# Patient Record
Sex: Male | Born: 1970 | Race: Black or African American | Hispanic: No | Smoking: Never smoker
Health system: Southern US, Community
[De-identification: ages and names within clinical notes are randomized; demographics above are authoritative.]

## PROBLEM LIST (undated history)

## (undated) DIAGNOSIS — G4700 Insomnia, unspecified: Secondary | ICD-10-CM

## (undated) DIAGNOSIS — Z9889 Other specified postprocedural states: Secondary | ICD-10-CM

## (undated) DIAGNOSIS — E8881 Metabolic syndrome: Secondary | ICD-10-CM

## (undated) DIAGNOSIS — I10 Essential (primary) hypertension: Secondary | ICD-10-CM

## (undated) DIAGNOSIS — R7989 Other specified abnormal findings of blood chemistry: Secondary | ICD-10-CM

## (undated) DIAGNOSIS — E669 Obesity, unspecified: Secondary | ICD-10-CM

## (undated) DIAGNOSIS — E785 Hyperlipidemia, unspecified: Secondary | ICD-10-CM

## (undated) DIAGNOSIS — B009 Herpesviral infection, unspecified: Secondary | ICD-10-CM

## (undated) DIAGNOSIS — Z8042 Family history of malignant neoplasm of prostate: Secondary | ICD-10-CM

## (undated) DIAGNOSIS — K219 Gastro-esophageal reflux disease without esophagitis: Secondary | ICD-10-CM

## (undated) DIAGNOSIS — N200 Calculus of kidney: Secondary | ICD-10-CM

## (undated) DIAGNOSIS — N529 Male erectile dysfunction, unspecified: Secondary | ICD-10-CM

## (undated) DIAGNOSIS — R7301 Impaired fasting glucose: Secondary | ICD-10-CM

## (undated) DIAGNOSIS — E88819 Insulin resistance, unspecified: Secondary | ICD-10-CM

## (undated) DIAGNOSIS — G4733 Obstructive sleep apnea (adult) (pediatric): Secondary | ICD-10-CM

## (undated) HISTORY — DX: Other specified abnormal findings of blood chemistry: R79.89

## (undated) HISTORY — DX: Insulin resistance, unspecified: E88.819

## (undated) HISTORY — DX: Essential (primary) hypertension: I10

## (undated) HISTORY — DX: Herpesviral infection, unspecified: B00.9

## (undated) HISTORY — DX: Gastro-esophageal reflux disease without esophagitis: K21.9

## (undated) HISTORY — DX: Insomnia, unspecified: G47.00

## (undated) HISTORY — DX: Obesity, unspecified: E66.9

## (undated) HISTORY — DX: Obstructive sleep apnea (adult) (pediatric): G47.33

## (undated) HISTORY — DX: Impaired fasting glucose: R73.01

## (undated) HISTORY — PX: MOUTH SURGERY: SHX715

## (undated) HISTORY — DX: Metabolic syndrome: E88.81

## (undated) HISTORY — DX: Male erectile dysfunction, unspecified: N52.9

## (undated) HISTORY — PX: APPENDECTOMY: SHX54

## (undated) HISTORY — DX: Hyperlipidemia, unspecified: E78.5

## (undated) HISTORY — DX: Family history of malignant neoplasm of prostate: Z80.42

## (undated) HISTORY — DX: Calculus of kidney: N20.0

---

## 1999-12-28 ENCOUNTER — Encounter (INDEPENDENT_AMBULATORY_CARE_PROVIDER_SITE_OTHER): Payer: Self-pay | Admitting: *Deleted

## 1999-12-28 ENCOUNTER — Inpatient Hospital Stay (HOSPITAL_COMMUNITY): Admission: EM | Admit: 1999-12-28 | Discharge: 1999-12-29 | Payer: Self-pay | Admitting: Emergency Medicine

## 2001-08-15 ENCOUNTER — Encounter: Payer: Self-pay | Admitting: Internal Medicine

## 2001-08-15 ENCOUNTER — Encounter: Admission: RE | Admit: 2001-08-15 | Discharge: 2001-08-15 | Payer: Self-pay | Admitting: Internal Medicine

## 2008-10-03 ENCOUNTER — Ambulatory Visit: Payer: Self-pay | Admitting: Family Medicine

## 2008-11-14 ENCOUNTER — Ambulatory Visit: Payer: Self-pay | Admitting: Family Medicine

## 2009-03-24 ENCOUNTER — Ambulatory Visit: Payer: Self-pay | Admitting: Family Medicine

## 2009-04-22 ENCOUNTER — Ambulatory Visit: Payer: Self-pay | Admitting: Family Medicine

## 2009-06-19 ENCOUNTER — Ambulatory Visit: Payer: Self-pay | Admitting: Family Medicine

## 2010-11-27 NOTE — Op Note (Signed)
[place]. [facility]  Patient:    Charles Rojas, Charles Rojas                    MRN: [id number] Proc. Date: 12/28/99 Adm. Date:  [id number] Disc. Date: [id number] Attending:  Arlis Porta                           Operative Report  PREOPERATIVE DIAGNOSIS:  Acute appendicitis.  POSTOPERATIVE DIAGNOSIS:  Acute appendicitis.  OPERATION PERFORMED:  Laparoscopic appendectomy.  SURGEON:  Adolph Pollack, M.D.  ASSISTANT:  None.  ANESTHESIA:  General.  INDICATIONS FOR PROCEDURE:  The patient is a 40 year old male who presented to the emergency department with signs and symptoms of classic appendicitis and elevated white blood count.  He was brought to the operating room now for appendectomy.  He has chosen laparoscopic technique as recommended by me.  DESCRIPTION OF PROCEDURE:  He was placed supine on the operating table and the general anesthetic was administered.  A Foley catheter was placed in the bladder.  The abdomen was sterilely prepped and draped.  Local anesthetic of 0.5% plain Marcaine was infiltrated in the subumbilical region and a subumbilical incision made incising the skin sharply and carrying this down through the subcutaneous tissue bluntly.  Midline fascia was identified and a 1 cm incision was made in the midline fascia.  The peritoneum was identified and a small incision made in the peritoneum under direct vision and the peritoneal cavity was entered.  A pursestring suture of 0 Vicryl was placed around the fascial edges.  A Hasson trocar was introduced into the peritoneal cavity and a pneumoperitoneum created by insufflation of CO2 gas.  Next the laparoscope was introduced. What I noted was a very large edematous distal appendix with adhered omentum to it.  Under direct vision, a 5 mm trocar was placed into the left lower quadrant, one in the right upper quadrant.  The omentum was swept free from the appendix.  The mesoappendix  was grasped and was very thick.  It was divided slowly using the Harmonic scalpel until I identified the junction of the appendix and the cecum.  After the junction had been skeletonized and the appendiceal artery divided using the Harmonic scalpel, the appendix was them amputated from the cecum using the Endo GIA stapler.  The inflamed appendix was placed in an Endo pouch bag and it was removed from the abdominal cavity and sent to pathology.  Next, the right lower quadrant area was copiously irrigated and inspected for hemostasis.  No active bleeding was noted and the majority of the irrigation fluid was evacuated.  The left lower quadrant trocar was  removed and no bleeding noted.  The midline trocar was removed and the fascial defect closed under laparoscopic vision with a 0 Vicryl pursestring suture.  The right upper quadrant 5 mm trocar was removed and a pneumoperitoneum was released.  The skin incision was then closed with 4-0 Monocryl subcuticular stitches followed by Steri-Strips and sterile dressings.  The patient tolerated the procedure well without apparent complications and was taken to the recovery room in satisfactory condition. DD:  12/29/99 TD:  12/31/99 Job: [id number]

## 2010-11-27 NOTE — H&P (Signed)
[place]. [facility]  Patient:    Charles Rojas, Charles Rojas                    MRN: [id number] Adm. Date:  [id number] Attending:  Arlis Porta CC:         Charles Rojas. Charles Rojas, M.D.                         History and Physical  CHIEF COMPLAINT: Right lower quadrant abdominal pain.  HISTORY OF PRESENT ILLNESS: This patient is a 40 year old male, otherwise healthy, who awoke this morning at 6 a.m. and had some diffuse lower abdominal periumbilical pain.  This continued to be fairly severe throughout the day and then it radiated to his right lower quadrant.  It was associated with some nausea, vomiting, and anorexia.  He had a bowel movement this morning without relief.  He tried taking ibuprofen and Kaopectate but had no relief with that. He denied any fever or chills.  He stated that approximately a year ago he ad a similar episode but which was very short-lived.  He saw a gastroenterologist and the work-up was negative.  He was seen at the [facility] clinic, where urinalysis demonstrated trace blood.  WBC was 16,400 with a hemoglobin of 13.7.  He was subsequently sent to the emergency department because of his persistent abdominal pain.  PAST MEDICAL HISTORY: No chronic illnesses.  PAST SURGICAL HISTORY: Wisdom tooth extraction.  ALLERGIES: No known drug allergies.  MEDICATIONS: None.  SOCIAL HISTORY: He denies tobacco or alcohol use.  He works two jobs, one of which is for [employer].  He is married.  FAMILY HISTORY: Mother has diabetes.  REVIEW OF SYSTEMS: CARDIOVASCULAR: No known heart disease or hypertension. PULMONARY: No asthma, pneumonia, or tuberculosis.  GI: No peptic ulcer disease, hepatitis, colitis.  GU: No history of kidney stones, dysuria, or hematuria.  ENDOCRINE: No diabetes or thyroid disease.  NEUROLOGIC: No seizure disorder.  HEMATOLOGIC: No known bleeding disorders or transfusions.  PHYSICAL EXAMINATION:  GENERAL:  Well-developed, well-nourished male in no acute distress.  He is pleasant and cooperative.  VITAL SIGNS: Temperature 99.5 degrees, blood pressure 145/68, pulse 93.  HEENT: EOMI.  Sclerae clear.  NECK: Supple without any palpable masses or thyromegaly.  CARDIOVASCULAR: Heart demonstrates regular rate and rhythm without murmur.  PULMONARY: Breath sounds are equal and clear.  Respirations are unlabored.  ABDOMEN: Soft.  There is right lower quadrant focal guarding and tenderness to both palpation and percussion.  Hypoactive bowel sounds are present.  No masses are palpable.  MUSCULOSKELETAL: Extremities are warm, without cyanosis or edema.  LABORATORY DATA: WBC in the emergency department was 16,500, hemoglobin 13.5. Electrolyte panel was within normal limits.  IMPRESSION: Right lower quadrant abdominal pain - findings consistent with acute appendicitis.  Other differential diagnoses could include mesenteric adenitis versus Crohns disease, although I doubt the latter.  PLAN: Appendectomy.  I did explain the procedure and the risks including, but not limited to bleeding, infection, risks of anesthesia, accidental injury to bladder or other intra-abdominal organs.  Both he and his wife seem to understand this and they agreed to proceed. DD:  12/28/99 TD:  12/29/99 Job: [id number]

## 2011-01-05 ENCOUNTER — Ambulatory Visit (INDEPENDENT_AMBULATORY_CARE_PROVIDER_SITE_OTHER): Payer: BC Managed Care – PPO | Admitting: Medical

## 2011-01-05 ENCOUNTER — Encounter: Payer: Self-pay | Admitting: Medical

## 2011-01-05 DIAGNOSIS — R319 Hematuria, unspecified: Secondary | ICD-10-CM

## 2011-01-05 DIAGNOSIS — B009 Herpesviral infection, unspecified: Secondary | ICD-10-CM

## 2011-01-05 DIAGNOSIS — Z8042 Family history of malignant neoplasm of prostate: Secondary | ICD-10-CM

## 2011-01-05 DIAGNOSIS — Z113 Encounter for screening for infections with a predominantly sexual mode of transmission: Secondary | ICD-10-CM

## 2011-01-05 LAB — CBC WITH DIFFERENTIAL/PLATELET
Basophils Absolute: 0 10*3/uL (ref 0.0–0.1)
Basophils Relative: 1 % (ref 0–1)
Eosinophils Absolute: 0.1 10*3/uL (ref 0.0–0.7)
Eosinophils Relative: 1 % (ref 0–5)
HCT: 44.6 % (ref 39.0–52.0)
Hemoglobin: 13.6 g/dL (ref 13.0–17.0)
Lymphocytes Relative: 32 % (ref 12–46)
Lymphs Abs: 2.5 10*3/uL (ref 0.7–4.0)
MCH: 23.7 pg — ABNORMAL LOW (ref 26.0–34.0)
MCHC: 30.5 g/dL (ref 30.0–36.0)
MCV: 77.8 fL — ABNORMAL LOW (ref 78.0–100.0)
Monocytes Absolute: 1 10*3/uL (ref 0.1–1.0)
Monocytes Relative: 13 % — ABNORMAL HIGH (ref 3–12)
Neutro Abs: 4.1 10*3/uL (ref 1.7–7.7)
Neutrophils Relative %: 53 % (ref 43–77)
Platelets: 319 10*3/uL (ref 150–400)
RBC: 5.73 MIL/uL (ref 4.22–5.81)
RDW: 14.5 % (ref 11.5–15.5)
WBC: 7.6 10*3/uL (ref 4.0–10.5)

## 2011-01-05 LAB — COMPREHENSIVE METABOLIC PANEL
ALT: 25 U/L (ref 0–53)
AST: 21 U/L (ref 0–37)
Albumin: 4.3 g/dL (ref 3.5–5.2)
Alkaline Phosphatase: 63 U/L (ref 39–117)
BUN: 11 mg/dL (ref 6–23)
CO2: 24 mEq/L (ref 19–32)
Calcium: 9.7 mg/dL (ref 8.4–10.5)
Chloride: 105 mEq/L (ref 96–112)
Creat: 0.88 mg/dL (ref 0.50–1.35)
Glucose, Bld: 84 mg/dL (ref 70–99)
Potassium: 4.1 mEq/L (ref 3.5–5.3)
Sodium: 139 mEq/L (ref 135–145)
Total Bilirubin: 0.3 mg/dL (ref 0.3–1.2)
Total Protein: 7.1 g/dL (ref 6.0–8.3)

## 2011-01-05 MED ORDER — VALACYCLOVIR HCL 500 MG PO TABS: ORAL_TABLET | ORAL | Status: DC

## 2011-01-05 NOTE — Progress Notes (Signed)
Subjective:   HPI  Charles Rojas. is a 40 y.o. male who presents for hematuria.  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  Otherwise he has been in his normal state of health, without complaints. Denies prior blood in urine. He denies any urinary problems, no history of STDs other than herpes, no concern for STDs currently. He is in a monogamous relationship. He denies any recent trauma. No abdominal pain or back pain. There is prostate cancer history in the family.  No other aggravating or relieving factors.  No other c/o.  The following portions of the patient's history were reviewed and updated as appropriate: allergies, current medications, past family history, past medical history, past social history, past surgical history and problem list.   Review of Systems Constitutional: denies fever, chills, sweats, unexpected weight change, anorexia, fatigue Dermatology: denies rash Cardiology: denies chest pain, palpitations, edema Respiratory: denies cough, shortness of breath, wheezing Gastroenterology: denies abdominal pain, nausea, vomiting, diarrhea, constipation Hematology: denies bleeding or bruising problems Musculoskeletal: denies arthralgias, myalgias, joint swelling, back pain Urology: denies dysuria, difficulty urinating, hematuria, urinary frequency, urgency     Objective:   Physical Exam  General appearance: alert, no distress, WD/WN, black male Heart: RRR, normal S1, S2, no murmurs Lungs: CTA bilaterally, no wheezes, rhonchi, or rales Abdomen: +bs, soft, non tender, non distended, no masses, no hepatomegaly, no splenomegaly Back: non tender Extremities: no edema, no cyanosis, no clubbing Pulses: 2+ symmetric GU: Normal external male genitalia, no lesions, no masses, no lymphadenopathy Rectal: Anus normal appearing, nontender, prostate within normal limits   Assessment :    Encounter Diagnoses  Name Primary?  . Hematuria Yes  .  Family history of prostate cancer   . Screening for venereal disease   . Herpes      Plan:    Hematuria - asymptomatic found on recent DOT physical.  Additional eval today.  If all tests today normal, repeat UA in 2-3 wks.  If still positive, consider urology referral.  Labs today - urine culture, urinalysis, chlamydia and gonorrhea swabs, and CBC.  Refilled his Valtrex today which controls his infrequent outbreaks.

## 2011-01-06 ENCOUNTER — Encounter: Payer: Self-pay | Admitting: Family Medicine

## 2011-01-06 LAB — GC/CHLAMYDIA PROBE AMP, GENITAL
Chlamydia, DNA Probe: NEGATIVE
GC Probe Amp, Genital: NEGATIVE

## 2011-01-07 LAB — URINE CULTURE
Colony Count: NO GROWTH
Organism ID, Bacteria: NO GROWTH

## 2011-01-08 ENCOUNTER — Telehealth: Payer: Self-pay | Admitting: *Deleted

## 2011-01-08 NOTE — Telephone Encounter (Addendum)
Message copied by Dorthula Perfect on Fri Jan 08, 2011 12:36 PM ------      Message from: Aleen Campi, Kermit Balo      Created: Fri Jan 08, 2011  7:45 AM       All labs normal - liver, kidney, lytes, urine culture, gonorrhea and chlamydia all normal.  Repeat nurse visit urinalysis in 2-3 weeks, also BP check at that time.    Pt notified of all lab results.  Pt will call back to schedule a nurse visit for urinalysis and BP check.  CM, LPN

## 2011-01-30 ENCOUNTER — Emergency Department (HOSPITAL_COMMUNITY)
Admission: EM | Admit: 2011-01-30 | Discharge: 2011-01-31 | Disposition: A | Discharge status: Home / Self Care | Payer: BC Managed Care – PPO | Attending: Emergency Medicine | Admitting: Emergency Medicine

## 2011-01-30 ENCOUNTER — Emergency Department (HOSPITAL_COMMUNITY): Payer: BC Managed Care – PPO

## 2011-01-30 DIAGNOSIS — N200 Calculus of kidney: Secondary | ICD-10-CM | POA: Insufficient documentation

## 2011-01-30 DIAGNOSIS — R109 Unspecified abdominal pain: Secondary | ICD-10-CM | POA: Insufficient documentation

## 2011-01-30 DIAGNOSIS — R110 Nausea: Secondary | ICD-10-CM | POA: Insufficient documentation

## 2011-01-30 LAB — URINE MICROSCOPIC-ADD ON

## 2011-01-30 LAB — URINALYSIS, ROUTINE W REFLEX MICROSCOPIC
Bilirubin Urine: NEGATIVE
Glucose, UA: NEGATIVE mg/dL
Ketones, ur: NEGATIVE mg/dL
Leukocytes, UA: NEGATIVE
Nitrite: NEGATIVE
Protein, ur: NEGATIVE mg/dL
Specific Gravity, Urine: 1.034 — ABNORMAL HIGH (ref 1.005–1.030)
Urobilinogen, UA: 0.2 mg/dL (ref 0.0–1.0)
pH: 6 (ref 5.0–8.0)

## 2011-04-20 ENCOUNTER — Ambulatory Visit (INDEPENDENT_AMBULATORY_CARE_PROVIDER_SITE_OTHER): Payer: BC Managed Care – PPO | Admitting: Family Medicine

## 2011-04-20 ENCOUNTER — Encounter: Payer: Self-pay | Admitting: Family Medicine

## 2011-04-20 VITALS — BP 144/100 | HR 84 | Ht 72.0 in | Wt 225.0 lb

## 2011-04-20 DIAGNOSIS — M25529 Pain in unspecified elbow: Secondary | ICD-10-CM

## 2011-04-20 DIAGNOSIS — M25522 Pain in left elbow: Secondary | ICD-10-CM

## 2011-04-20 NOTE — Progress Notes (Signed)
  Subjective:    Patient ID: Charles Rojas., male    DOB: 1971-07-10, 40 y.o.   MRN: [id number]  HPI He is here for evaluation of left elbow pain. The pain occurs when he leans on his elbow and goes away very quickly after he takes the pressure off of it. He has no history of injury to this.  Review of Systems     Objective:   Physical Exam Full motion of the available. No tenderness to palpation over the lateral or medial epicondyle. No joint effusion noted. No tenderness over the olecranon process.       Assessment & Plan:  Left elbow pain probable compression of cutaneous nerve I reassured him that he was not in any danger. Recommend that he change oval position to get rid of the pain

## 2011-04-20 NOTE — Patient Instructions (Signed)
Change elbow positions to get rid of the pain

## 2011-06-09 ENCOUNTER — Telehealth: Payer: Self-pay | Admitting: Family Medicine

## 2011-06-09 ENCOUNTER — Other Ambulatory Visit: Payer: Self-pay | Admitting: Internal Medicine

## 2011-06-09 MED ORDER — VALACYCLOVIR HCL 500 MG PO TABS: ORAL_TABLET | ORAL | Status: DC

## 2011-06-09 NOTE — Telephone Encounter (Signed)
Sent valtrex to the wrong pharmacy earlier. Did send it to [facility]

## 2011-06-09 NOTE — Telephone Encounter (Signed)
ok 

## 2011-06-09 NOTE — Telephone Encounter (Signed)
Refilled med for pt

## 2011-06-09 NOTE — Telephone Encounter (Signed)
Is this okay to refill? 

## 2011-12-02 ENCOUNTER — Other Ambulatory Visit: Payer: Self-pay | Admitting: Family Medicine

## 2011-12-02 NOTE — Telephone Encounter (Signed)
RX REFILL FOR VALTREX

## 2012-07-26 ENCOUNTER — Ambulatory Visit (INDEPENDENT_AMBULATORY_CARE_PROVIDER_SITE_OTHER): Payer: BC Managed Care – PPO | Admitting: Medical

## 2012-07-26 ENCOUNTER — Encounter: Payer: Self-pay | Admitting: Medical

## 2012-07-26 VITALS — BP 130/80 | HR 80 | Temp 98.2°F | Resp 16 | Wt 225.0 lb

## 2012-07-26 DIAGNOSIS — N509 Disorder of male genital organs, unspecified: Secondary | ICD-10-CM

## 2012-07-26 DIAGNOSIS — G4700 Insomnia, unspecified: Secondary | ICD-10-CM

## 2012-07-26 DIAGNOSIS — Z113 Encounter for screening for infections with a predominantly sexual mode of transmission: Secondary | ICD-10-CM

## 2012-07-26 LAB — CBC WITH DIFFERENTIAL/PLATELET
Basophils Absolute: 0 10*3/uL (ref 0.0–0.1)
Basophils Relative: 0 % (ref 0–1)
Eosinophils Absolute: 0.1 10*3/uL (ref 0.0–0.7)
Eosinophils Relative: 2 % (ref 0–5)
HCT: 41.2 % (ref 39.0–52.0)
Hemoglobin: 13.2 g/dL (ref 13.0–17.0)
Lymphocytes Relative: 37 % (ref 12–46)
Lymphs Abs: 2.3 10*3/uL (ref 0.7–4.0)
MCH: 23.7 pg — ABNORMAL LOW (ref 26.0–34.0)
MCHC: 32 g/dL (ref 30.0–36.0)
MCV: 74.1 fL — ABNORMAL LOW (ref 78.0–100.0)
Monocytes Absolute: 0.8 10*3/uL (ref 0.1–1.0)
Monocytes Relative: 14 % — ABNORMAL HIGH (ref 3–12)
Neutro Abs: 2.9 10*3/uL (ref 1.7–7.7)
Neutrophils Relative %: 47 % (ref 43–77)
Platelets: 328 10*3/uL (ref 150–400)
RBC: 5.56 MIL/uL (ref 4.22–5.81)
RDW: 15 % (ref 11.5–15.5)
WBC: 6.1 10*3/uL (ref 4.0–10.5)

## 2012-07-26 LAB — RPR

## 2012-07-26 MED ORDER — ESZOPICLONE 2 MG PO TABS: 2.0000 mg | ORAL_TABLET | Freq: Every day | ORAL | Status: DC

## 2012-07-26 MED ORDER — DOXYCYCLINE HYCLATE 100 MG PO TABS: 100.0000 mg | ORAL_TABLET | Freq: Two times a day (BID) | ORAL | Status: DC

## 2012-07-26 NOTE — Progress Notes (Signed)
Subjective: Here for herpes outbreak.  Normally gets outbreak on head of penis.  This time seems to have outbreak on left scrotum and where this is rubbing the thigh, seems to have a rash on the thigh.  Did not start as vesicle like usual.  More of a bump/pimple like lesion.  Has Valtrex at home, started this Friday when he noticed symptoms beginning.  It is helping.  Last night inner thigh started getting some burning where the scrotal lesion had been rubbing.   Separated, has 1 sexual partner currently x over 1 year.  Other than genital herpes, no other prior STD.    He also c/o sleep problems. Has used lunesta in the past, wants refill.  Has lots of stressors currently, having hard time getting to sleep due to mind thinking about thinks.  Usually sleeps 5-6 hours in general, but lately, much less at times.  Dealing with divorce issues from ex - wife.   Exercises on the job, [employer].   Objective: Gen: wd, wn, nad Skin: left scrotum with 0.8 cm diameter raised, slightly tender lesion, cystic appearing with small opening in the middle with mild erythema, no obvious drainage.   Small area of faint erythema in left inguinal crease where the scrotal lesion has rubbed.   otherwise penis, testicles nontender, no oother mass or skin lesion, no lymphadenopathy  Assessment: Encounter Diagnoses  Name Primary?  . Skin lesion of scrotum Yes  . Screen for STD (sexually transmitted disease)   . Insomnia    Plan: Skin lesion - cyst vs folliculitis vs STD, but doesn't appear to be herpetic.  Begin doxycycline, warm compresses, labs today.  STD screening today  insomnia - script for lunesta, discussed use prn, sparingly, work on stress reduction, relaxation, exercise.    F/u pending labs.

## 2012-07-27 ENCOUNTER — Telehealth: Payer: Self-pay | Admitting: Medical

## 2012-07-27 LAB — GC/CHLAMYDIA PROBE AMP, URINE
Chlamydia, Swab/Urine, PCR: NEGATIVE
GC Probe Amp, Urine: NEGATIVE

## 2012-07-27 LAB — HIV ANTIBODY (ROUTINE TESTING W REFLEX): HIV: NONREACTIVE

## 2012-07-31 NOTE — Telephone Encounter (Signed)
LM

## 2012-12-18 ENCOUNTER — Ambulatory Visit: Payer: Self-pay | Admitting: Family Medicine

## 2013-01-10 ENCOUNTER — Ambulatory Visit (INDEPENDENT_AMBULATORY_CARE_PROVIDER_SITE_OTHER): Payer: BC Managed Care – PPO | Admitting: Family Medicine

## 2013-01-10 ENCOUNTER — Encounter: Payer: Self-pay | Admitting: Family Medicine

## 2013-01-10 VITALS — BP 140/86 | HR 90 | Wt 220.0 lb

## 2013-01-10 DIAGNOSIS — M545 Low back pain: Secondary | ICD-10-CM

## 2013-01-10 NOTE — Progress Notes (Signed)
  Subjective:    Patient ID: Charles Saver., male    DOB: [birth year], 42 y.o.   MRN: [id number]  HPI Yesterday morning he woke up with back pain. He does not remember any injury prior to that he does work 2 jobs. He went to work however work half a day because of discomfort he did go to his other job and was able to work is normal 4 hours. He did use a heating pad last night and states that since same discomfort today He did try one muscle relaxer. The pain is made worse with motion but no radiation, numbness or tingling.   Review of Systems     Objective:   Physical Exam No tenderness to palpation of his back or lesions noted. Normal lumbar curve with good motion. No tenderness over SI joints. Hip motion is normal. DTRs were slightly diminished bilaterally. Negative straight leg raising.       Assessment & Plan:  Low back pain  back care instructions given. Also demonstrated proper lifting sitting and standing as well as discussed proper posturing while lying in bed. Recommend Aleve. He is to return here if no improvement within the next 10 days or if he gets worse. He will call me if he needs stronger pain medication.

## 2013-01-10 NOTE — Patient Instructions (Signed)
Back Pain, Adult Low back pain is very common. About 1 in 5 people have back pain.The cause of low back pain is rarely dangerous. The pain often gets better over time.About half of people with a sudden onset of back pain feel better in just 2 weeks. About 8 in 10 people feel better by 6 weeks.  CAUSES Some common causes of back pain include:  Strain of the muscles or ligaments supporting the spine.  Wear and tear (degeneration) of the spinal discs.  Arthritis.  Direct injury to the back. DIAGNOSIS Most of the time, the direct cause of low back pain is not known.However, back pain can be treated effectively even when the exact cause of the pain is unknown.Answering your caregiver's questions about your overall health and symptoms is one of the most accurate ways to make sure the cause of your pain is not dangerous. If your caregiver needs more information, he or she may order lab work or imaging tests (X-rays or MRIs).However, even if imaging tests show changes in your back, this usually does not require surgery. HOME CARE INSTRUCTIONS For many people, back pain returns.Since low back pain is rarely dangerous, it is often a condition that people can learn to manageon their own.   Remain active. It is stressful on the back to sit or stand in one place. Do not sit, drive, or stand in one place for more than 30 minutes at a time. Take short walks on level surfaces as soon as pain allows.Try to increase the length of time you walk each day.  Do not stay in bed.Resting more than 1 or 2 days can delay your recovery.  Do not avoid exercise or work.Your body is made to move.It is not dangerous to be active, even though your back may hurt.Your back will likely heal faster if you return to being active before your pain is gone.  Pay attention to your body when you bend and lift. Many people have less discomfortwhen lifting if they bend their knees, keep the load close to their bodies,and  avoid twisting. Often, the most comfortable positions are those that put less stress on your recovering back.  Find a comfortable position to sleep. Use a firm mattress and lie on your side with your knees slightly bent. If you lie on your back, put a pillow under your knees.  Only take over-the-counter or prescription medicines as directed by your caregiver. Over-the-counter medicines to reduce pain and inflammation are often the most helpful.Your caregiver may prescribe muscle relaxant drugs.These medicines help dull your pain so you can more quickly return to your normal activities and healthy exercise.  Put ice on the injured area.  Put ice in a plastic bag.  Place a towel between your skin and the bag.  Leave the ice on for 15-20 minutes, 3-4 times a day for the first 2 to 3 days. After that, ice and heat may be alternated to reduce pain and spasms.  Ask your caregiver about trying back exercises and gentle massage. This may be of some benefit.  Avoid feeling anxious or stressed.Stress increases muscle tension and can worsen back pain.It is important to recognize when you are anxious or stressed and learn ways to manage it.Exercise is a great option. SEEK MEDICAL CARE IF:  You have pain that is not relieved with rest or medicine.  You have pain that does not improve in 1 week.  You have new symptoms.  You are generally not feeling well. SEEK   IMMEDIATE MEDICAL CARE IF:   You have pain that radiates from your back into your legs.  You develop new bowel or bladder control problems.  You have unusual weakness or numbness in your arms or legs.  You develop nausea or vomiting.  You develop abdominal pain.  You feel faint. Document Released: 06/28/2005 Document Revised: 12/28/2011 Document Reviewed: 11/16/2010 [facility] Patient Information 2014 [place], [place]. Take 2 Aleve twice per day use heat for 20 minutes 2 or 3 times per day. I have no problem to continue to work.  Will day my rules in terms of proper posturing lifting sitting and

## 2013-06-26 ENCOUNTER — Encounter: Payer: Self-pay | Admitting: Medical

## 2013-06-26 ENCOUNTER — Ambulatory Visit (INDEPENDENT_AMBULATORY_CARE_PROVIDER_SITE_OTHER): Payer: BC Managed Care – PPO | Admitting: Medical

## 2013-06-26 VITALS — BP 122/88 | HR 96 | Temp 98.4°F | Resp 18 | Wt 223.0 lb

## 2013-06-26 DIAGNOSIS — M5380 Other specified dorsopathies, site unspecified: Secondary | ICD-10-CM

## 2013-06-26 DIAGNOSIS — S39012A Strain of muscle, fascia and tendon of lower back, initial encounter: Secondary | ICD-10-CM

## 2013-06-26 DIAGNOSIS — IMO0002 Reserved for concepts with insufficient information to code with codable children: Secondary | ICD-10-CM

## 2013-06-26 DIAGNOSIS — M62830 Muscle spasm of back: Secondary | ICD-10-CM

## 2013-06-26 MED ORDER — IBUPROFEN 600 MG PO TABS: 600.0000 mg | ORAL_TABLET | Freq: Three times a day (TID) | ORAL | Status: DC | PRN

## 2013-06-26 MED ORDER — CYCLOBENZAPRINE HCL 10 MG PO TABS: ORAL_TABLET | ORAL | Status: DC

## 2013-06-26 NOTE — Progress Notes (Signed)
  Subjective:    Charles Rojas. is a 42 y.o. male who presents for evaluation of low back pain. The patient has had recurrent self limited episodes of low back pain in the past. Symptoms have been present for 1 day and are gradually worsening.  Onset was related to / precipitated by a twisting movement while lifting boxes last night.  The pain is located in the left lumbar area and does not radiate. The pain is described as aching, sharp, soreness and stiffness and occurs all day. Symptoms are exacerbated by extension, flexion, lifting, standing and twisting. Symptoms are improved by nothing. He has also tried narcotic pain medications (percocet left over from prior problem) which provided some symptom relief. He has no other symptoms associated with the back pain. The patient has no "red flag" history indicative of complicated back pain.  The following portions of the patient's history were reviewed and updated as appropriate: allergies, current medications, past family history, past medical history, past social history, past surgical history and problem list.  Review of Systems Constitutional: denies fever, chills, sweats, unexpected weight change, anorexia, fatigue Dermatology: rash, bruising, redness Cardiology: denies chest pain, palpitations, edema, orthopnea, paroxysmal nocturnal dyspnea Respiratory: denies cough, shortness of breath, dyspnea on exertion, wheezing, hemoptysis Gastroenterology: denies abdominal pain, nausea, vomiting, diarrhea, constipation, blood in stool, changes in bowel movement, dysphagia Musculoskeletal: denies arthralgias, myalgias, joint swelling, neck pain, cramping Urology: denies dysuria, difficulty urinating, hematuria, urinary frequency, urgency, incontinence Neurology: no headache, weakness, tingling, numbness, falls, dizziness      Objective:    Filed Vitals:   06/26/13 1118  BP: 122/88  Pulse: 96  Temp: 98.4 F (36.9 C)  Resp: 18    General  appearance: alert, no distress, WD/WN, male Neck: supple, no lymphadenopathy, no thyromegaly, no masses, normal ROM Chest: non tender, normal shape and expansion Abdomen: +bs, soft, non tender, non distended, no masses, no hepatomegaly, no splenomegaly, no bruits Back: Left lumbar paraspinal tenderness, positive spasm, pain with flexion and extension, otherwise nontender, no deformity MSK: UE nontender, normal ROM, LE nontender, normal ROM, no obvious deformity Extremities: no edema, no cyanosis, no clubbing Pulses: 2+ symmetric, upper and lower extremities     Assessment:   Encounter Diagnoses  Name Primary?  . Back strain, initial encounter Yes  . Muscle spasm of back       Plan:   Natural history and expected course discussed. Questions answered. Proper lifting, bending technique discussed. Stretching exercises discussed. Regular aerobic and trunk strengthening exercises discussed. Short (3-4 day) period of relative rest recommended until acute symptoms improve. Heat to affected area as needed for local pain relief.  Medications prescribed: Flexeril prn QHS, Ibuprofen 600mg , q8hr prn  Note given for work  Follow up: prn

## 2013-11-15 ENCOUNTER — Ambulatory Visit (INDEPENDENT_AMBULATORY_CARE_PROVIDER_SITE_OTHER): Payer: BC Managed Care – PPO | Admitting: Medical

## 2013-11-15 ENCOUNTER — Encounter: Payer: Self-pay | Admitting: Medical

## 2013-11-15 VITALS — BP 134/88 | HR 78 | Temp 98.2°F | Resp 15 | Wt 226.0 lb

## 2013-11-15 DIAGNOSIS — N529 Male erectile dysfunction, unspecified: Secondary | ICD-10-CM

## 2013-11-15 DIAGNOSIS — B009 Herpesviral infection, unspecified: Secondary | ICD-10-CM

## 2013-11-15 MED ORDER — SILDENAFIL CITRATE 50 MG PO TABS: 50.0000 mg | ORAL_TABLET | Freq: Every day | ORAL | Status: DC | PRN

## 2013-11-15 MED ORDER — AVANAFIL 100 MG PO TABS: 1.0000 | ORAL_TABLET | Freq: Every day | ORAL | Status: DC

## 2013-11-15 MED ORDER — VALACYCLOVIR HCL 500 MG PO TABS: 500.0000 mg | ORAL_TABLET | Freq: Two times a day (BID) | ORAL | Status: DC

## 2013-11-15 NOTE — Progress Notes (Signed)
Subjective:     Charles Rojas. is a 43 y.o. male here for evaluation and treatment of erectile dysfunction. Onset of dysfunction was 2 months ago and onset was gradual. Patient states he has difficulty both attaining and maintaining erection. Full erections occur on awakening and these are softer than in the past. Partial erections occur with intercourse but not good. Libido is not affected. Risk factors for ED include none.   Patient is not a smoker. Has tried OTC herbal with some improvement.  Patient denies history of antihypertensive medications: no prior BP medication, beta blocker use, diabetes mellitus, hypogonadism and pelvic radiation. Patient's expectations of sexual function is good.  Patient describes relationship with partner as good.  Patient's cardiac risk factors are: male gender and obesity (BMI >= 30 kg/m2).  Previous cardiac testing: none.  Also needs refill on Valtrex, works well for prn use.  No Known Allergies  Current Outpatient Prescriptions on File Prior to Visit  Medication Sig Dispense Refill  . cyclobenzaprine (FLEXERIL) 10 MG tablet 1/2-1 tablet po QHS or up to BID  15 tablet  0  . eszopiclone (LUNESTA) 2 MG TABS Take 1 tablet (2 mg total) by mouth at bedtime. Take immediately before bedtime  20 tablet  1  . ibuprofen (ADVIL,MOTRIN) 600 MG tablet Take 1 tablet (600 mg total) by mouth every 8 (eight) hours as needed.  30 tablet  0   No current facility-administered medications on file prior to visit.    Past Medical History  Diagnosis Date  . Dyslipidemia   . Family history of prostate cancer   . Herpes     History reviewed. No pertinent past surgical history.  Family History  Problem Relation Age of Onset  . Cancer Father     prostate  . Cancer Paternal Uncle     prostate    History   Social History  . Marital Status: Legally Separated    Spouse Name: N/A    Number of Children: N/A  . Years of Education: N/A   Occupational History  . Not on  file.   Social History Main Topics  . Smoking status: Never Smoker   . Smokeless tobacco: Never Used  . Alcohol Use: No  . Drug Use: No  . Sexual Activity: Not on file   Other Topics Concern  . Not on file   Social History Narrative  . No narrative on file    Reviewed their medical, surgical, family, social, medication, and allergy history and updated chart as appropriate.    Objective:   Objective: BP 134/88  Pulse 78  Temp(Src) 98.2 F (36.8 C) (Oral)  Resp 15  Wt 226 lb (102.513 kg)  General appearance: alert, no distress, WD/WN, AA male Neck: supple, no lymphadenopathy, no thyromegaly, no masses, no bruits Heart: RRR, normal S1, S2, no murmurs Lungs: CTA bilaterally, no wheezes, rhonchi, or rales Abdomen: +bs, soft, non tender, non distended, no masses, no hepatomegaly, no splenomegaly, no bruits Pulses: 2+ symmetric, upper and lower extremities, normal cap refill Ext: no cyanosis, clubbing or edema GU: normal male external genitalia, circumcised, spermatocele present right scrotum, no other mass, nontender, no hernia   Adult ECG Report  Indication: erectile dysfunction  Rate: 68bpm  Rhythm: normal sinus rhythm  QRS Axis: 28 degrees  PR Interval: 17ms  QRS Duration: 12ms  QTc: 371ms  Conduction Disturbances: none  Other Abnormalities: nonspecific T wave invesion III  Patient's cardiac risk factors are: male gender and obesity (BMI >=  30 kg/m2).  EKG comparison: 2010, unchanged  Narrative Interpretation: nonspecific T wave changes     Assessment:      Encounter Diagnoses  Name Primary?  . Erectile dysfunction Yes  . Herpes      Plan:   Erectile Dysfunction - Reviewed pathophysiology and differential diagnosis of erectile dysfunction with the patient.  Discussed treatment options.  Begin trial of Viagra 50mg , 1/2-1 or Stendra 100mg  1/2-1 tablet as trial samples.  Discussed potential risks of medications including hypotension and priapism.   Discussed proper use of medication.  Questions were answered.    Herpes - refilled Valtrex for prn use.  Return for fasting labs.

## 2013-11-28 ENCOUNTER — Encounter: Payer: BC Managed Care – PPO | Admitting: Medical

## 2013-12-19 ENCOUNTER — Ambulatory Visit (INDEPENDENT_AMBULATORY_CARE_PROVIDER_SITE_OTHER): Payer: BC Managed Care – PPO | Admitting: Medical

## 2013-12-19 ENCOUNTER — Encounter: Payer: Self-pay | Admitting: Medical

## 2013-12-19 VITALS — BP 112/80 | HR 60 | Temp 97.9°F | Resp 14 | Ht 73.0 in | Wt 226.0 lb

## 2013-12-19 DIAGNOSIS — Z8042 Family history of malignant neoplasm of prostate: Secondary | ICD-10-CM

## 2013-12-19 DIAGNOSIS — Z0000 Encounter for general adult medical examination without abnormal findings: Secondary | ICD-10-CM

## 2013-12-19 DIAGNOSIS — Z23 Encounter for immunization: Secondary | ICD-10-CM

## 2013-12-19 DIAGNOSIS — N529 Male erectile dysfunction, unspecified: Secondary | ICD-10-CM

## 2013-12-19 LAB — POCT URINALYSIS DIPSTICK
Bilirubin, UA: NEGATIVE
Glucose, UA: NEGATIVE
Ketones, UA: NEGATIVE
Leukocytes, UA: NEGATIVE
Nitrite, UA: NEGATIVE
Protein, UA: NEGATIVE
Spec Grav, UA: 1.015
Urobilinogen, UA: NEGATIVE
pH, UA: 5

## 2013-12-19 NOTE — Patient Instructions (Signed)
Thank you for giving me the opportunity to serve you today.    Your diagnosis today includes: Encounter Diagnoses  Name Primary?  . Routine general medical examination at a health care facility Yes  . Need for Tdap vaccination   . Family history of prostate cancer   . Erectile dysfunction      Specific recommendations today include:  We updated your tetanus, diphtheria and pertussis vaccine today  Exercise regularly, try to cut back on high sugar foods and drinks, eat a healthy diet  We will call with lab results and recommendations  See eye doctor yearly  See dentist yearly  Return pending labs.  Insomnia Insomnia is frequent trouble falling and/or staying asleep. Insomnia can be a long term problem or a short term problem. Both are common. Insomnia can be a short term problem when the wakefulness is related to a certain stress or worry. Long term insomnia is often related to ongoing stress during waking hours and/or poor sleeping habits. Overtime, sleep deprivation itself can make the problem worse. Every little thing feels more severe because you are overtired and your ability to cope is decreased. CAUSES   Stress, anxiety, and depression.  Poor sleeping habits.  Distractions such as TV in the bedroom.  Naps close to bedtime.  Engaging in emotionally charged conversations before bed.  Technical reading before sleep.  Alcohol and other sedatives. They may make the problem worse. They can hurt normal sleep patterns and normal dream activity.  Stimulants such as caffeine for several hours prior to bedtime.  Pain syndromes and shortness of breath can cause insomnia.  Exercise late at night.  Changing time zones may cause sleeping problems (jet lag). It is sometimes helpful to have someone observe your sleeping patterns. They should look for periods of not breathing during the night (sleep apnea). They should also look to see how long those periods last. If you  live alone or observers are uncertain, you can also be observed at a sleep clinic where your sleep patterns will be professionally monitored. Sleep apnea requires a checkup and treatment. Give your caregivers your medical history. Give your caregivers observations your family has made about your sleep.  SYMPTOMS   Not feeling rested in the morning.  Anxiety and restlessness at bedtime.  Difficulty falling and staying asleep. TREATMENT   Your caregiver may prescribe treatment for an underlying medical disorders. Your caregiver can give advice or help if you are using alcohol or other drugs for self-medication. Treatment of underlying problems will usually eliminate insomnia problems.  Medications can be prescribed for short time use. They are generally not recommended for lengthy use.  Over-the-counter sleep medicines are not recommended for lengthy use. They can be habit forming.  You can promote easier sleeping by making lifestyle changes such as:  Using relaxation techniques that help with breathing and reduce muscle tension.  Exercising earlier in the day.  Changing your diet and the time of your last meal. No night time snacks.  Establish a regular time to go to bed.  Counseling can help with stressful problems and worry.  Soothing music and white noise may be helpful if there are background noises you cannot remove.  Stop tedious detailed work at least one hour before bedtime. HOME CARE INSTRUCTIONS   Keep a diary. Inform your caregiver about your progress. This includes any medication side effects. See your caregiver regularly. Take note of:  Times when you are asleep.  Times when you are awake during  the night.  The quality of your sleep.  How you feel the next day. This information will help your caregiver care for you.  Get out of bed if you are still awake after 15 minutes. Read or do some quiet activity. Keep the lights down. Wait until you feel sleepy and go  back to bed.  Keep regular sleeping and waking hours. Avoid naps.  Exercise regularly.  Avoid distractions at bedtime. Distractions include watching television or engaging in any intense or detailed activity like attempting to balance the household checkbook.  Develop a bedtime ritual. Keep a familiar routine of bathing, brushing your teeth, climbing into bed at the same time each night, listening to soothing music. Routines increase the success of falling to sleep faster.  Use relaxation techniques. This can be using breathing and muscle tension release routines. It can also include visualizing peaceful scenes. You can also help control troubling or intruding thoughts by keeping your mind occupied with boring or repetitive thoughts like the old concept of counting sheep. You can make it more creative like imagining planting one beautiful flower after another in your backyard garden.  During your day, work to eliminate stress. When this is not possible use some of the previous suggestions to help reduce the anxiety that accompanies stressful situations. MAKE SURE YOU:   Understand these instructions.  Will watch your condition.  Will get help right away if you are not doing well or get worse. Document Released: 06/25/2000 Document Revised: 09/20/2011 Document Reviewed: 07/26/2007 [facility] Patient Information 2014 [place].

## 2013-12-19 NOTE — Addendum Note (Signed)
Addended by: Armanda Magic on: 12/19/2013 09:12 AM   Modules accepted: Orders

## 2013-12-19 NOTE — Progress Notes (Signed)
Subjective:   HPI  Charles Rojas. is a 43 y.o. male who presents for a complete physical.  Preventative care: Last ophthalmology visit:YES DR. Ricki Miller Last dental visit:YES Last colonoscopy:N/A Last prostate exam: N/A Last EKG:11/2008 Last labs:2014  Prior vaccinations: TD or Tdap:Unknown Influenza:N/A Pneumococcal:N/A Shingles/Zostavax:N/A  Advanced directive:N/A Health care power of attorney:N/A Living will:N/A  Concerns: ED - after last visit both [place] and Viagra worked fine.   Reviewed their medical, surgical, family, social, medication, and allergy history and updated chart as appropriate.  Past Medical History  Diagnosis Date  . Dyslipidemia   . Family history of prostate cancer   . Herpes   . Insomnia   . Erectile dysfunction     Past Surgical History  Procedure Laterality Date  . Appendectomy      History   Social History  . Marital Status: Legally Separated    Spouse Name: N/A    Number of Children: N/A  . Years of Education: N/A   Occupational History  . Not on file.   Social History Main Topics  . Smoking status: Never Smoker   . Smokeless tobacco: Never Used  . Alcohol Use: No     Comment: rarely  . Drug Use: No  . Sexual Activity: Yes   Other Topics Concern  . Not on file   Social History Narrative   Going through divorce, has 2 children (57 and 88), lives on his own currently. 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 Denies coffee but drinks 1-2 sodas per day, eats a lot of junk food.    Family History  Problem Relation Age of Onset  . Cancer Father     prostate, age 32  . Hyperlipidemia Father   . Cancer Paternal Uncle     prostate  . Diabetes Mother   . Hypertension Mother   . Osteoarthritis Mother     Knees  . Heart disease Neg Hx   . Stroke Neg Hx     Current outpatient prescriptions:cyclobenzaprine (FLEXERIL) 10 MG tablet, 1/2-1 tablet po QHS or up to BID, Disp: 15  tablet, Rfl: 0;  eszopiclone (LUNESTA) 2 MG TABS, Take 1 tablet (2 mg total) by mouth at bedtime. Take immediately before bedtime, Disp: 20 tablet, Rfl: 1;  ibuprofen (ADVIL,MOTRIN) 600 MG tablet, Take 1 tablet (600 mg total) by mouth every 8 (eight) hours as needed., Disp: 30 tablet, Rfl: 0 valACYclovir (VALTREX) 500 MG tablet, Take 1 tablet (500 mg total) by mouth 2 (two) times daily., Disp: 30 tablet, Rfl: 2;  Avanafil (STENDRA) 100 MG TABS, Take 1 tablet by mouth daily., Disp: 3 tablet, Rfl: 0;  sildenafil (VIAGRA) 50 MG tablet, Take 1 tablet (50 mg total) by mouth daily as needed for erectile dysfunction., Disp: 4 tablet, Rfl: 0  No Known Allergies     Review of Systems Constitutional: -fever, -chills, -sweats, -unexpected weight change, -decreased appetite, -fatigue Allergy: -sneezing, -itching, -congestion Dermatology: -changing moles, --rash, -lumps ENT: -runny nose, -ear pain, -sore throat, -hoarseness, -sinus pain, -teeth pain, - ringing in ears, -hearing loss, -nosebleeds Cardiology: -chest pain, -palpitations, -swelling, -difficulty breathing when lying flat, -waking up short of breath Respiratory: -cough, -shortness of breath, -difficulty breathing with exercise or exertion, -wheezing, -coughing up blood Gastroenterology: -abdominal pain, -nausea, -vomiting, -diarrhea, -constipation, -blood in stool, -changes in bowel movement, -difficulty swallowing or eating Hematology: -bleeding, -bruising  Musculoskeletal: -joint aches, -muscle aches, -joint swelling, -back pain, -neck pain, -cramping, -changes in gait Ophthalmology:  denies vision changes, eye redness, itching, discharge Urology: -burning with urination, -difficulty urinating, -blood in urine, -urinary frequency, -urgency, -incontinence Neurology: -headache, -weakness, -tingling, -numbness, -memory loss, -falls, -dizziness Psychology: -depressed mood, -agitation, -sleep problems     Objective:   Physical Exam  BP 112/80   Pulse 60  Temp(Src) 97.9 F (36.6 C) (Oral)  Resp 14  Ht 6\' 1"  (1.854 m)  Wt 226 lb (102.513 kg)  BMI 29.82 kg/m2  General appearance: alert, no distress, WD/WN, AA male Skin: numerous skin tags around the neck, large keloid of the umbilicus, otherwise no worrisome lesions HEENT: normocephalic, conjunctiva/corneas normal, sclerae anicteric, PERRLA, EOMi, nares patent, no discharge or erythema, pharynx normal Oral cavity: MMM, tongue normal, teeth in good repair Neck: supple, no lymphadenopathy, no thyromegaly, no masses, normal ROM, no bruits Chest: non tender, normal shape and expansion Heart: RRR, normal S1, S2, no murmurs Lungs: CTA bilaterally, no wheezes, rhonchi, or rales Abdomen: +bs, soft, non tender, non distended, no masses, no hepatomegaly, no splenomegaly, no bruits Back: non tender, normal ROM, no scoliosis Musculoskeletal: upper extremities non tender, no obvious deformity, normal ROM throughout, lower extremities non tender, no obvious deformity, normal ROM throughout Extremities: no edema, no cyanosis, no clubbing Pulses: 2+ symmetric, upper and lower extremities, normal cap refill Neurological: alert, oriented x 3, CN2-12 intact, strength normal upper extremities and lower extremities, sensation normal throughout, DTRs 2+ throughout, no cerebellar signs, gait normal Psychiatric: normal affect, behavior normal, pleasant  GU: normal male external genitalia, circumcised, nontender, no masses, no hernia, no lymphadenopathy Rectal: anus nontender, no lesions, normal tone, prostate WNL   Assessment and Plan :    Encounter Diagnoses  Name Primary?  . Routine general medical examination at a health care facility Yes  . Need for Tdap vaccination   . Family history of prostate cancer   . Erectile dysfunction     Physical exam - discussed healthy lifestyle, diet, exercise, preventative care, vaccinations, and addressed their concerns.   Counseled on the Tdap (tetanus,  diptheria, and acellular pertussis) vaccine.  Vaccine information sheet given. Tdap vaccine given after consent obtained. We discussed the risk and benefits of prostate cancer screening, family history of prostate cancer, baseline prostate exam and PSA today. ED - did fine on both [place] and Viagra. Genital herpes - does fine on current medication.  No recent flare up. Follow-up pending labs

## 2013-12-20 ENCOUNTER — Other Ambulatory Visit: Payer: Self-pay

## 2013-12-20 LAB — LIPID PANEL
Cholesterol: 205 mg/dL — ABNORMAL HIGH (ref 0–200)
HDL: 48 mg/dL (ref >39)
LDL Cholesterol: 133 mg/dL — ABNORMAL HIGH (ref 0–99)
Total CHOL/HDL Ratio: 4.3 Ratio
Triglycerides: 119 mg/dL (ref <150)
VLDL: 24 mg/dL (ref 0–40)

## 2013-12-20 LAB — CBC
HCT: 42.8 % (ref 39.0–52.0)
Hemoglobin: 13.9 g/dL (ref 13.0–17.0)
MCH: 23.8 pg — ABNORMAL LOW (ref 26.0–34.0)
MCHC: 32.5 g/dL (ref 30.0–36.0)
MCV: 73.3 fL — ABNORMAL LOW (ref 78.0–100.0)
Platelets: 348 10*3/uL (ref 150–400)
RBC: 5.84 MIL/uL — ABNORMAL HIGH (ref 4.22–5.81)
RDW: 15.6 % — ABNORMAL HIGH (ref 11.5–15.5)
WBC: 7.1 10*3/uL (ref 4.0–10.5)

## 2013-12-20 LAB — PSA: PSA: 0.82 ng/mL (ref <=4.00)

## 2013-12-20 LAB — COMPREHENSIVE METABOLIC PANEL
ALT: 19 U/L (ref 0–53)
AST: 17 U/L (ref 0–37)
Albumin: 4.5 g/dL (ref 3.5–5.2)
Alkaline Phosphatase: 67 U/L (ref 39–117)
BUN: 12 mg/dL (ref 6–23)
CO2: 24 mEq/L (ref 19–32)
Calcium: 9.3 mg/dL (ref 8.4–10.5)
Chloride: 105 mEq/L (ref 96–112)
Creat: 0.99 mg/dL (ref 0.50–1.35)
Glucose, Bld: 88 mg/dL (ref 70–99)
Potassium: 4.1 mEq/L (ref 3.5–5.3)
Sodium: 139 mEq/L (ref 135–145)
Total Bilirubin: 0.5 mg/dL (ref 0.2–1.2)
Total Protein: 7.5 g/dL (ref 6.0–8.3)

## 2013-12-20 LAB — TESTOSTERONE: Testosterone: 320 ng/dL (ref 300–890)

## 2013-12-20 LAB — TSH: TSH: 1.367 u[IU]/mL (ref 0.350–4.500)

## 2013-12-20 MED ORDER — AVANAFIL 100 MG PO TABS: 1.0000 | ORAL_TABLET | Freq: Every day | ORAL | Status: DC

## 2014-11-15 ENCOUNTER — Encounter (HOSPITAL_COMMUNITY): Payer: Self-pay | Admitting: *Deleted

## 2014-11-15 ENCOUNTER — Emergency Department (INDEPENDENT_AMBULATORY_CARE_PROVIDER_SITE_OTHER)
Admission: EM | Admit: 2014-11-15 | Discharge: 2014-11-15 | Disposition: A | Discharge status: Home / Self Care | Payer: Worker's Compensation | Source: Home / Self Care | Attending: Family Medicine | Admitting: Family Medicine

## 2014-11-15 DIAGNOSIS — S81802A Unspecified open wound, left lower leg, initial encounter: Secondary | ICD-10-CM

## 2014-11-15 DIAGNOSIS — S8992XA Unspecified injury of left lower leg, initial encounter: Secondary | ICD-10-CM

## 2014-11-15 HISTORY — DX: Other specified postprocedural states: Z98.890

## 2014-11-15 MED ORDER — NAPROXEN 500 MG PO TABS: 500.0000 mg | ORAL_TABLET | Freq: Two times a day (BID) | ORAL | Status: AC

## 2014-11-15 NOTE — Discharge Instructions (Signed)
RICE: Routine Care for Injuries The routine care of many injuries includes Rest, Ice, Compression, and Elevation (RICE). HOME CARE INSTRUCTIONS  Rest is needed to allow your body to heal. Routine activities can usually be resumed when comfortable. Injured tendons and bones can take up to 6 weeks to heal. Tendons are the cord-like structures that attach muscle to bone.  Ice following an injury helps keep the swelling down and reduces pain.  Put ice in a plastic bag.  Place a towel between your skin and the bag.  Leave the ice on for 15-20 minutes, 3-4 times a day, or as directed by your health care provider. Do this while awake, for the first 24 to 48 hours. After that, continue as directed by your caregiver.  Compression helps keep swelling down. It also gives support and helps with discomfort. If an elastic bandage has been applied, it should be removed and reapplied every 3 to 4 hours. It should not be applied tightly, but firmly enough to keep swelling down. Watch fingers or toes for swelling, bluish discoloration, coldness, numbness, or excessive pain. If any of these problems occur, remove the bandage and reapply loosely. Contact your caregiver if these problems continue.  Elevation helps reduce swelling and decreases pain. With extremities, such as the arms, hands, legs, and feet, the injured area should be placed near or above the level of the heart, if possible. SEEK IMMEDIATE MEDICAL CARE IF:  You have persistent pain and swelling.  You develop redness, numbness, or unexpected weakness.  Your symptoms are getting worse rather than improving after several days. These symptoms may indicate that further evaluation or further X-rays are needed. Sometimes, X-rays may not show a small broken bone (fracture) until 1 week or 10 days later. Make a follow-up appointment with your caregiver. Ask when your X-ray results will be ready. Make sure you get your X-ray results. Document Released:  10/10/2000 Document Revised: 07/03/2013 Document Reviewed: 11/27/2010 [employer] Patient Information 2015 [employer]. This information is not intended to replace advice given to you by your health care provider. Make sure you discuss any questions you have with your health care provider.  
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
## 2015-03-18 ENCOUNTER — Ambulatory Visit (INDEPENDENT_AMBULATORY_CARE_PROVIDER_SITE_OTHER): Payer: BLUE CROSS/BLUE SHIELD | Admitting: Family Medicine

## 2015-03-18 ENCOUNTER — Encounter: Payer: Self-pay | Admitting: Family Medicine

## 2015-03-18 VITALS — BP 120/100 | HR 88 | Wt 228.2 lb

## 2015-03-18 DIAGNOSIS — D170 Benign lipomatous neoplasm of skin and subcutaneous tissue of head, face and neck: Secondary | ICD-10-CM | POA: Diagnosis not present

## 2015-03-18 NOTE — Progress Notes (Signed)
   Subjective:    Patient ID: Charles Rojas., male    DOB: [birth year], 44 y.o.   MRN: [id number]  HPI He is here for consult concerning lesion in the occipital area. His barber noted as well as his girlfriend.  Review of Systems     Objective:   Physical Exam He has a round smooth movable lesion in the occipital area. It is approximately one and half centimeters in size       Assessment & Plan:  Lipoma of scalp I reassured him that this was nothing of any major concern. Flu shot was offered and he refused.

## 2016-02-04 ENCOUNTER — Other Ambulatory Visit: Payer: Self-pay | Admitting: Medical

## 2016-02-04 ENCOUNTER — Telehealth: Payer: Self-pay | Admitting: Family Medicine

## 2016-02-04 MED ORDER — VALACYCLOVIR HCL 500 MG PO TABS: 500.0000 mg | ORAL_TABLET | Freq: Two times a day (BID) | ORAL | 0 refills | Status: DC

## 2016-02-04 NOTE — Telephone Encounter (Signed)
Requesting refill on Valtrex 500 mg

## 2016-02-04 NOTE — Telephone Encounter (Signed)
Med sent, get him in for physical

## 2016-02-04 NOTE — Telephone Encounter (Signed)
Has appt 02/12/16

## 2016-02-12 ENCOUNTER — Ambulatory Visit (INDEPENDENT_AMBULATORY_CARE_PROVIDER_SITE_OTHER): Payer: BLUE CROSS/BLUE SHIELD | Admitting: Medical

## 2016-02-12 ENCOUNTER — Encounter: Payer: Self-pay | Admitting: Medical

## 2016-02-12 VITALS — BP 130/98 | HR 81 | Ht 71.25 in | Wt 230.0 lb

## 2016-02-12 DIAGNOSIS — Z0000 Encounter for general adult medical examination without abnormal findings: Secondary | ICD-10-CM | POA: Diagnosis not present

## 2016-02-12 DIAGNOSIS — Z125 Encounter for screening for malignant neoplasm of prostate: Secondary | ICD-10-CM | POA: Diagnosis not present

## 2016-02-12 DIAGNOSIS — A6000 Herpesviral infection of urogenital system, unspecified: Secondary | ICD-10-CM | POA: Diagnosis not present

## 2016-02-12 DIAGNOSIS — R030 Elevated blood-pressure reading, without diagnosis of hypertension: Secondary | ICD-10-CM | POA: Diagnosis not present

## 2016-02-12 DIAGNOSIS — L918 Other hypertrophic disorders of the skin: Secondary | ICD-10-CM | POA: Diagnosis not present

## 2016-02-12 DIAGNOSIS — E669 Obesity, unspecified: Secondary | ICD-10-CM | POA: Diagnosis not present

## 2016-02-12 DIAGNOSIS — Z8042 Family history of malignant neoplasm of prostate: Secondary | ICD-10-CM | POA: Insufficient documentation

## 2016-02-12 DIAGNOSIS — G4700 Insomnia, unspecified: Secondary | ICD-10-CM | POA: Diagnosis not present

## 2016-02-12 DIAGNOSIS — N529 Male erectile dysfunction, unspecified: Secondary | ICD-10-CM | POA: Diagnosis not present

## 2016-02-12 LAB — CBC
HCT: 39.8 % (ref 38.5–50.0)
Hemoglobin: 12.8 g/dL — ABNORMAL LOW (ref 13.2–17.1)
MCH: 23.7 pg — ABNORMAL LOW (ref 27.0–33.0)
MCHC: 32.2 g/dL (ref 32.0–36.0)
MCV: 73.7 fL — ABNORMAL LOW (ref 80.0–100.0)
MPV: 9.1 fL (ref 7.5–12.5)
Platelets: 355 10*3/uL (ref 140–400)
RBC: 5.4 MIL/uL (ref 4.20–5.80)
RDW: 15.3 % — ABNORMAL HIGH (ref 11.0–15.0)
WBC: 7.4 10*3/uL (ref 4.0–10.5)

## 2016-02-12 LAB — COMPREHENSIVE METABOLIC PANEL
ALT: 18 U/L (ref 9–46)
AST: 18 U/L (ref 10–40)
Albumin: 4.4 g/dL (ref 3.6–5.1)
Alkaline Phosphatase: 64 U/L (ref 40–115)
BUN: 12 mg/dL (ref 7–25)
CO2: 25 mmol/L (ref 20–31)
Calcium: 9.4 mg/dL (ref 8.6–10.3)
Chloride: 104 mmol/L (ref 98–110)
Creat: 1.06 mg/dL (ref 0.60–1.35)
Glucose, Bld: 74 mg/dL (ref 65–99)
Potassium: 4.3 mmol/L (ref 3.5–5.3)
Sodium: 139 mmol/L (ref 135–146)
Total Bilirubin: 0.4 mg/dL (ref 0.2–1.2)
Total Protein: 7.4 g/dL (ref 6.1–8.1)

## 2016-02-12 LAB — LIPID PANEL
Cholesterol: 192 mg/dL (ref 125–200)
HDL: 48 mg/dL (ref >=40)
LDL Cholesterol: 124 mg/dL (ref <130)
Total CHOL/HDL Ratio: 4 Ratio (ref <=5.0)
Triglycerides: 100 mg/dL (ref <150)
VLDL: 20 mg/dL (ref <30)

## 2016-02-12 LAB — TSH: TSH: 1.18 mIU/L (ref 0.40–4.50)

## 2016-02-12 MED ORDER — VALACYCLOVIR HCL 1 G PO TABS: ORAL_TABLET | ORAL | 2 refills | Status: DC

## 2016-02-12 MED ORDER — SILDENAFIL CITRATE 50 MG PO TABS: 50.0000 mg | ORAL_TABLET | Freq: Every day | ORAL | 5 refills | Status: DC | PRN

## 2016-02-12 NOTE — Progress Notes (Signed)
Subjective:   HPI  Charles Rojas. is a 45 y.o. male who presents for a complete physical.  Concerns: None, wants prostate checked  Reviewed their medical, surgical, family, social, medication, and allergy history and updated chart as appropriate.  Past Medical History:  Diagnosis Date  . Erectile dysfunction   . Family history of prostate cancer   . H/O oral surgery   . Herpes    genital  . Insomnia   . Obesity     Past Surgical History:  Procedure Laterality Date  . APPENDECTOMY    . MOUTH SURGERY      Social History   Social History  . Marital status: Legally Separated    Spouse name: N/A  . Number of children: N/A  . Years of education: N/A   Occupational History  . Not on file.   Social History Main Topics  . Smoking status: Never Smoker  . Smokeless tobacco: Never Used  . Alcohol use Yes     Comment: rarely  . Drug use: No  . Sexual activity: Not on file   Other Topics Concern  . Not on file   Social History Narrative   Divorced, has girlfriend, has 2 children (50 and 74), lives with girlfriend. 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 Denies coffee but drinks 1-2 sodas per day, eats a lot of junk food.  02/2016    Family History  Problem Relation Age of Onset  . Cancer Father     prostate, age 55  . Hyperlipidemia Father   . Diabetes Mother   . Hypertension Mother   . Osteoarthritis Mother     Knees  . Cancer Paternal Uncle     prostate  . Cancer Maternal Uncle     thyroid  . Heart disease Neg Hx   . Stroke Neg Hx      Current Outpatient Prescriptions:  .  sildenafil (VIAGRA) 50 MG tablet, Take 1 tablet (50 mg total) by mouth daily as needed for erectile dysfunction., Disp: 10 tablet, Rfl: 5 .  valACYclovir (VALTREX) 1000 MG tablet, 2 tablets po BID x 1 day for flare up, Disp: 20 tablet, Rfl: 2  No Known Allergies     Review of Systems Constitutional: -fever, -chills, -sweats, -unexpected  weight change, -decreased appetite, -fatigue Allergy: -sneezing, -itching, -congestion Dermatology: -changing moles, --rash, -lumps ENT: -runny nose, -ear pain, -sore throat, -hoarseness, -sinus pain, -teeth pain, - ringing in ears, -hearing loss, -nosebleeds Cardiology: -chest pain, -palpitations, -swelling, -difficulty breathing when lying flat, -waking up short of breath Respiratory: -cough, -shortness of breath, -difficulty breathing with exercise or exertion, -wheezing, -coughing up blood Gastroenterology: -abdominal pain, -nausea, -vomiting, -diarrhea, -constipation, -blood in stool, -changes in bowel movement, -difficulty swallowing or eating Hematology: -bleeding, -bruising  Musculoskeletal: -joint aches, -muscle aches, -joint swelling, -back pain, -neck pain, -cramping, -changes in gait Ophthalmology: denies vision changes, eye redness, itching, discharge Urology: -burning with urination, -difficulty urinating, -blood in urine, -urinary frequency, -urgency, -incontinence Neurology: -headache, -weakness, -tingling, -numbness, -memory loss, -falls, -dizziness Psychology: -depressed mood, -agitation, -sleep problems     Objective:   Physical Exam  BP (!) 130/98   Pulse 81   Ht 5' 11.25" (1.81 m)   Wt 230 lb (104.3 kg)   BMI 31.85 kg/m   General appearance: alert, no distress, WD/WN, AA male Skin: numerous skin tags around the neck, large keloid of the umbilicus, otherwise no worrisome lesions HEENT: normocephalic, conjunctiva/corneas normal, sclerae anicteric,  PERRLA, EOMi, nares patent, no discharge or erythema, pharynx normal Oral cavity: MMM, tongue normal, teeth in good repair Neck: supple, no lymphadenopathy, no thyromegaly, no masses, normal ROM, no bruits Chest: non tender, normal shape and expansion Heart: RRR, normal S1, S2, no murmurs Lungs: CTA bilaterally, no wheezes, rhonchi, or rales Abdomen: +bs, soft, right upper port surgical scar, umbilical surgical scar, non  tender, non distended, no masses, no hepatomegaly, no splenomegaly, no bruits Back: non tender, normal ROM, no scoliosis Musculoskeletal: upper extremities non tender, no obvious deformity, normal ROM throughout, lower extremities non tender, no obvious deformity, normal ROM throughout Extremities: no edema, no cyanosis, no clubbing Pulses: 2+ symmetric, upper and lower extremities, normal cap refill Neurological: alert, oriented x 3, CN2-12 intact, strength normal upper extremities and lower extremities, sensation normal throughout, DTRs 2+ throughout, no cerebellar signs, gait normal Psychiatric: normal affect, behavior normal, pleasant  GU: normal male external genitalia, circumcised, nontender, no masses, no hernia, no lymphadenopathy Rectal: anus nontender, no lesions, normal tone, prostate WNL   Assessment and Plan :    Encounter Diagnoses  Name Primary?  . Encounter for health maintenance examination in adult Yes  . Family history of prostate cancer in father   . Obesity   . Elevated blood-pressure reading without diagnosis of hypertension   . Screening for prostate cancer   . Genital herpes   . Insomnia   . Erectile dysfunction, unspecified erectile dysfunction type   . Skin tag     Physical exam - discussed healthy lifestyle, diet, exercise, preventative care, vaccinations, and addressed their concerns.   See your dentist yearly for routine dental care including hygiene visits twice yearly. See your eye doctor yearly for routine vision care. We discussed the risk and benefits of prostate cancer screening, family history of prostate cancer, baseline prostate exam and PSA today. Advised of new diagnosis of hypertension, risks of uncontrolled hypertension, treatment recommendations.   He will work on diet exercise and lifestyle changes first, salt avoidance.  He knows he can do better with weight loss and diet.  F/u within 63mo on BP Skin tags - numerous, advised   Refilled  medications for ED, genital herpes flare up prn use Follow-up pending labs  Charles Rojas was seen today for annual exam.  Diagnoses and all orders for this visit:  Encounter for health maintenance examination in adult -     CBC -     Comprehensive metabolic panel -     Hemoglobin A1c -     Lipid panel -     PSA(Must document that pt has been informed of limitations of PSA testing.) -     TSH  Family history of prostate cancer in father -     PSA(Must document that pt has been informed of limitations of PSA testing.)  Obesity -     Hemoglobin A1c  Elevated blood-pressure reading without diagnosis of hypertension  Screening for prostate cancer -     PSA(Must document that pt has been informed of limitations of PSA testing.)  Genital herpes  Insomnia  Erectile dysfunction, unspecified erectile dysfunction type  Skin tag  Other orders -     sildenafil (VIAGRA) 50 MG tablet; Take 1 tablet (50 mg total) by mouth daily as needed for erectile dysfunction. -     valACYclovir (VALTREX) 1000 MG tablet; 2 tablets po BID x 1 day for flare up

## 2016-02-13 LAB — HEMOGLOBIN A1C
Hgb A1c MFr Bld: 6 % — ABNORMAL HIGH (ref <5.7)
Mean Plasma Glucose: 126 mg/dL

## 2016-02-13 LAB — PSA: PSA: 0.68 ng/mL (ref <=4.00)

## 2016-05-18 ENCOUNTER — Ambulatory Visit (INDEPENDENT_AMBULATORY_CARE_PROVIDER_SITE_OTHER): Payer: BLUE CROSS/BLUE SHIELD | Admitting: Medical

## 2016-05-18 ENCOUNTER — Encounter: Payer: Self-pay | Admitting: Medical

## 2016-05-18 VITALS — BP 144/86 | HR 92 | Wt 218.8 lb

## 2016-05-18 DIAGNOSIS — H6123 Impacted cerumen, bilateral: Secondary | ICD-10-CM

## 2016-05-18 DIAGNOSIS — L723 Sebaceous cyst: Secondary | ICD-10-CM | POA: Diagnosis not present

## 2016-05-18 MED ORDER — CEPHALEXIN 500 MG PO CAPS: 500.0000 mg | ORAL_CAPSULE | Freq: Three times a day (TID) | ORAL | 0 refills | Status: DC

## 2016-05-18 NOTE — Patient Instructions (Signed)
Recommendations  regarding removing ear wax, consider mixing white vinegar and rubbing alcohol in a 1:1 mixture. Use a few drops in each ear twice weekly for a few weeks.  Clean the ear today with soap and water daily  After cleaning ear, try using a little rubbing alcohol drops daily for the next 4-5 days.  Begin Keflex antibiotic twice daily for 7-10 days  If worse or not improving in the next few days, then recheck

## 2016-05-18 NOTE — Progress Notes (Addendum)
Subjective: Chief Complaint  Patient presents with  . swolloen ear    swelling in rt ear x 5 day , no pain    Here for right ear swollen and tender x few days.  No prior ear troubles.  No ear drainage,   No sore throat, no drainage down throat.   No fever.   No wrestling, no jui jitsu, no swimming. No hearing issues.  No other aggravating or relieving factors. No other complaint.  Past Medical History:  Diagnosis Date  . Erectile dysfunction   . Family history of prostate cancer   . H/O oral surgery   . Herpes    genital  . Insomnia   . Obesity    Current Outpatient Prescriptions on File Prior to Visit  Medication Sig Dispense Refill  . valACYclovir (VALTREX) 1000 MG tablet 2 tablets po BID x 1 day for flare up 20 tablet 2  . sildenafil (VIAGRA) 50 MG tablet Take 1 tablet (50 mg total) by mouth daily as needed for erectile dysfunction. 10 tablet 5  . [DISCONTINUED] eszopiclone (LUNESTA) 2 MG TABS Take 1 tablet (2 mg total) by mouth at bedtime. Take immediately before bedtime 20 tablet 1   No current facility-administered medications on file prior to visit.    ROS as in subjective   Objective: BP (!) 144/86   Pulse 92   Wt 218 lb 12.8 oz (99.2 kg)   SpO2 98%   BMI 30.30 kg/m   Gen: wd, wn, nad Right ear pinna just superior to tragus with tender 31mm slightly swollen area with small amount of pus drainage.  No erythema or warmth.  otherwise pinna nontender, no other deformity Moderate wax bilat ear canals, TMs pearly bilat, no other abnormality   Assessment: Encounter Diagnoses  Name Primary?  . Sebaceous cyst of ear Yes  . Bilateral impacted cerumen     Plan: Begin keflex, good hygiene, warm compresses, and if not resolved within a week then recheck.  If worse in the meantime, redness, swelling, worse pain, then recheck sooner.    F/u next week for med check as planned.  Trevione was seen today for swolloen ear.  Diagnoses and all orders for this  visit:  Sebaceous cyst of ear  Bilateral impacted cerumen  Other orders -     cephALEXin (KEFLEX) 500 MG capsule; Take 1 capsule (500 mg total) by mouth 3 (three) times daily.

## 2016-05-24 ENCOUNTER — Ambulatory Visit (INDEPENDENT_AMBULATORY_CARE_PROVIDER_SITE_OTHER): Payer: BLUE CROSS/BLUE SHIELD | Admitting: Medical

## 2016-05-24 ENCOUNTER — Encounter: Payer: BLUE CROSS/BLUE SHIELD | Admitting: Medical

## 2016-05-24 ENCOUNTER — Encounter: Payer: Self-pay | Admitting: Medical

## 2016-05-24 VITALS — BP 142/88 | HR 98 | Wt 220.8 lb

## 2016-05-24 DIAGNOSIS — I10 Essential (primary) hypertension: Secondary | ICD-10-CM | POA: Diagnosis not present

## 2016-05-24 DIAGNOSIS — Z8249 Family history of ischemic heart disease and other diseases of the circulatory system: Secondary | ICD-10-CM | POA: Diagnosis not present

## 2016-05-24 DIAGNOSIS — N529 Male erectile dysfunction, unspecified: Secondary | ICD-10-CM

## 2016-05-24 MED ORDER — AMLODIPINE BESYLATE 5 MG PO TABS: 5.0000 mg | ORAL_TABLET | Freq: Every day | ORAL | 1 refills | Status: DC

## 2016-05-24 NOTE — Progress Notes (Signed)
Subjective: Chief Complaint  Patient presents with  . follow up     follow up discuss b/p meds    Here for 45mo f/u on hypertension.   New diagnosis 02/2016.   Since last visit in August has cut out soda, salt, very little sweet tea now, has lost about 10lb.    He feels fine.  No chest pain, no SOB, no edema.   Wife has mentioned possible apnea events, snoring, but he denies chronic fatigue, daytime somnolence.  Gets 5 hours of sleep per night, but working 2 jobs.  Works full time 40 hours per week with [employer], and 20 hours on the second job.  Part time [employer].  Has had this routine for 20 years.   Mom has HTN, but no family history of heart disease or stroke.  No other aggravating or relieving factors. No other complaint.  Past Medical History:  Diagnosis Date  . Erectile dysfunction   . Family history of prostate cancer   . H/O oral surgery   . Herpes    genital  . Insomnia   . Obesity    Current Outpatient Prescriptions on File Prior to Visit  Medication Sig Dispense Refill  . sildenafil (VIAGRA) 50 MG tablet Take 1 tablet (50 mg total) by mouth daily as needed for erectile dysfunction. 10 tablet 5  . valACYclovir (VALTREX) 1000 MG tablet 2 tablets po BID x 1 day for flare up 20 tablet 2  . [DISCONTINUED] eszopiclone (LUNESTA) 2 MG TABS Take 1 tablet (2 mg total) by mouth at bedtime. Take immediately before bedtime 20 tablet 1   No current facility-administered medications on file prior to visit.    ROS as in subjective   Objective: BP (!) 142/88   Pulse 98   Wt 220 lb 12.8 oz (100.2 kg)   SpO2 99%   BMI 30.58 kg/m   BP Readings from Last 3 Encounters:  05/24/16 (!) 142/88  05/18/16 (!) 144/86  02/12/16 (!) 130/98   Wt Readings from Last 3 Encounters:  05/24/16 220 lb 12.8 oz (100.2 kg)  05/18/16 218 lb 12.8 oz (99.2 kg)  02/12/16 230 lb (104.3 kg)   General appearance: alert, no distress, WD/WN, pleasant AA male Neck: supple, no lymphadenopathy, no  thyromegaly, no masses, no bruits Heart: RRR, normal S1, S2, no murmurs Lungs: CTA bilaterally, no wheezes, rhonchi, or rales Abdomen: +bs, soft, non tender, non distended, no masses, no hepatomegaly, no splenomegaly Pulses: 2+ symmetric, upper and lower extremities, normal cap refill Ext: no edema   Adult ECG Report  Indication: hypertension  Rate: 91 bpm  Rhythm: normal sinus rhythm  QRS Axis: 23 degrees  PR Interval: 172ms  QRS Duration: 49ms  QTc: 454ms  Conduction Disturbances: none  Other Abnormalities: none  Patient's cardiac risk factors are: hypertension and male gender.  EKG comparison: none  Narrative Interpretation: normal EKG     Assessment: Encounter Diagnoses  Name Primary?  . Essential hypertension Yes  . Family history of hypertension   . Erectile dysfunction, unspecified erectile dysfunction type     Plan: discussed BP findings, diagnosis of hypertension, risk factors, lifestyle modification, and congratulated him on his efforts thus far.  Begin trial of Amlodipine, c/t healthy diet, exercise, and c/t efforts to lose weight.   We can consider sleep study depending on if wife notes apnea at home.  He has some mild to moderate risks of sleep apnea based on symptoms.   He will get me  BP readings in 58mo, f/u 3-4 mo otherwise.

## 2016-05-24 NOTE — Patient Instructions (Signed)
Hypertension Hypertension, commonly called high blood pressure, is when the force of blood pumping through your arteries is too strong. Your arteries are the blood vessels that carry blood from your heart throughout your body. A blood pressure reading consists of a higher number over a lower number, such as 110/72. The higher number (systolic) is the pressure inside your arteries when your heart pumps. The lower number (diastolic) is the pressure inside your arteries when your heart relaxes. Ideally you want your blood pressure below 120/80. Hypertension forces your heart to work harder to pump blood. Your arteries may become narrow or stiff. Having untreated or uncontrolled hypertension can cause heart attack, stroke, kidney disease, and other problems. RISK FACTORS Some risk factors for high blood pressure are controllable. Others are not.  Risk factors you cannot control include:   Race. You may be at higher risk if you are African American.  Age. Risk increases with age.  Gender. Men are at higher risk than women before age 45 years. After age 65, women are at higher risk than men. Risk factors you can control include:  Not getting enough exercise or physical activity.  Being overweight.  Getting too much fat, sugar, calories, or salt in your diet.  Drinking too much alcohol. SIGNS AND SYMPTOMS Hypertension does not usually cause signs or symptoms. Extremely high blood pressure (hypertensive crisis) may cause headache, anxiety, shortness of breath, and nosebleed. DIAGNOSIS To check if you have hypertension, your health care provider will measure your blood pressure while you are seated, with your arm held at the level of your heart. It should be measured at least twice using the same arm. Certain conditions can cause a difference in blood pressure between your right and left arms. A blood pressure reading that is higher than normal on one occasion does not mean that you need treatment. If  it is not clear whether you have high blood pressure, you may be asked to return on a different day to have your blood pressure checked again. Or, you may be asked to monitor your blood pressure at home for 1 or more weeks. TREATMENT Treating high blood pressure includes making lifestyle changes and possibly taking medicine. Living a healthy lifestyle can help lower high blood pressure. You may need to change some of your habits. Lifestyle changes may include:  Following the DASH diet. This diet is high in fruits, vegetables, and whole grains. It is low in salt, red meat, and added sugars.  Keep your sodium intake below 2,300 mg per day.  Getting at least 30-45 minutes of aerobic exercise at least 4 times per week.  Losing weight if necessary.  Not smoking.  Limiting alcoholic beverages.  Learning ways to reduce stress. Your health care provider may prescribe medicine if lifestyle changes are not enough to get your blood pressure under control, and if one of the following is true:  You are 18-59 years of age and your systolic blood pressure is above 140.  You are 60 years of age or older, and your systolic blood pressure is above 150.  Your diastolic blood pressure is above 90.  You have diabetes, and your systolic blood pressure is over 140 or your diastolic blood pressure is over 90.  You have kidney disease and your blood pressure is above 140/90.  You have heart disease and your blood pressure is above 140/90. Your personal target blood pressure may vary depending on your medical conditions, your age, and other factors. HOME CARE INSTRUCTIONS    Have your blood pressure rechecked as directed by your health care provider.   Take medicines only as directed by your health care provider. Follow the directions carefully. Blood pressure medicines must be taken as prescribed. The medicine does not work as well when you skip doses. Skipping doses also puts you at risk for  problems.  Do not smoke.   Monitor your blood pressure at home as directed by your health care provider. SEEK MEDICAL CARE IF:   You think you are having a reaction to medicines taken.  You have recurrent headaches or feel dizzy.  You have swelling in your ankles.  You have trouble with your vision. SEEK IMMEDIATE MEDICAL CARE IF:  You develop a severe headache or confusion.  You have unusual weakness, numbness, or feel faint.  You have severe chest or abdominal pain.  You vomit repeatedly.  You have trouble breathing. MAKE SURE YOU:   Understand these instructions.  Will watch your condition.  Will get help right away if you are not doing well or get worse.   This information is not intended to replace advice given to you by your health care provider. Make sure you discuss any questions you have with your health care provider.   Document Released: 06/28/2005 Document Revised: 11/12/2014 Document Reviewed: 04/20/2013 Elsevier Interactive Patient Education 2016 [place] DASH stands for "Dietary Approaches to Stop Hypertension." The DASH eating plan is a healthy eating plan that has been shown to reduce high blood pressure (hypertension). Additional health benefits may include reducing the risk of type 2 diabetes mellitus, heart disease, and stroke. The DASH eating plan may also help with weight loss. WHAT DO I NEED TO KNOW ABOUT THE DASH EATING PLAN? For the DASH eating plan, you will follow these general guidelines:  Choose foods with a percent daily value for sodium of less than 5% (as listed on the food label).  Use salt-free seasonings or herbs instead of table salt or sea salt.  Check with your health care provider or pharmacist before using salt substitutes.  Eat lower-sodium products, often labeled as "lower sodium" or "no salt added."  Eat fresh foods.  Eat more vegetables, fruits, and low-fat dairy products.  Choose whole  grains. Look for the word "whole" as the first word in the ingredient list.  Choose fish and skinless chicken or [place] more often than red meat. Limit fish, poultry, and meat to 6 oz (170 g) each day.  Limit sweets, desserts, sugars, and sugary drinks.  Choose heart-healthy fats.  Limit cheese to 1 oz (28 g) per day.  Eat more home-cooked food and less restaurant, buffet, and fast food.  Limit fried foods.  Cook foods using methods other than frying.  Limit canned vegetables. If you do use them, rinse them well to decrease the sodium.  When eating at a restaurant, ask that your food be prepared with less salt, or no salt if possible. WHAT FOODS CAN I EAT? Seek help from a dietitian for individual calorie needs. Grains Whole grain or whole wheat bread. Brown rice. Whole grain or whole wheat pasta. Quinoa, bulgur, and whole grain cereals. Low-sodium cereals. Corn or whole wheat flour tortillas. Whole grain cornbread. Whole grain crackers. Low-sodium crackers. Vegetables Fresh or frozen vegetables (raw, steamed, roasted, or grilled). Low-sodium or reduced-sodium tomato and vegetable juices. Low-sodium or reduced-sodium tomato sauce and paste. Low-sodium or reduced-sodium canned vegetables.  Fruits All fresh, canned (in natural juice), or frozen fruits.  Meat and Other Protein Products Ground beef (85% or leaner), grass-fed beef, or beef trimmed of fat. Skinless chicken or [place]. Ground chicken or [place]. Pork trimmed of fat. All fish and seafood. Eggs. Dried beans, peas, or lentils. Unsalted nuts and seeds. Unsalted canned beans. Dairy Low-fat dairy products, such as skim or 1% milk, 2% or reduced-fat cheeses, low-fat ricotta or cottage cheese, or plain low-fat yogurt. Low-sodium or reduced-sodium cheeses. Fats and Oils Tub margarines without trans fats. Light or reduced-fat mayonnaise and salad dressings (reduced sodium). Avocado. Safflower, olive, or canola oils. Natural peanut or  almond butter. Other Unsalted popcorn and pretzels. The items listed above may not be a complete list of recommended foods or beverages. Contact your dietitian for more options. WHAT FOODS ARE NOT RECOMMENDED? Grains White bread. White pasta. White rice. Refined cornbread. Bagels and croissants. Crackers that contain trans fat. Vegetables Creamed or fried vegetables. Vegetables in a cheese sauce. Regular canned vegetables. Regular canned tomato sauce and paste. Regular tomato and vegetable juices. Fruits Dried fruits. Canned fruit in light or heavy syrup. Fruit juice. Meat and Other Protein Products Fatty cuts of meat. Ribs, chicken wings, bacon, sausage, bologna, salami, chitterlings, fatback, hot dogs, bratwurst, and packaged luncheon meats. Salted nuts and seeds. Canned beans with salt. Dairy Whole or 2% milk, cream, half-and-half, and cream cheese. Whole-fat or sweetened yogurt. Full-fat cheeses or blue cheese. Nondairy creamers and whipped toppings. Processed cheese, cheese spreads, or cheese curds. Condiments Onion and garlic salt, seasoned salt, table salt, and sea salt. Canned and packaged gravies. Worcestershire sauce. Tartar sauce. Barbecue sauce. Teriyaki sauce. Soy sauce, including reduced sodium. Steak sauce. Fish sauce. Oyster sauce. Cocktail sauce. Horseradish. Ketchup and mustard. Meat flavorings and tenderizers. Bouillon cubes. Hot sauce. Tabasco sauce. Marinades. Taco seasonings. Relishes. Fats and Oils Butter, stick margarine, lard, shortening, ghee, and bacon fat. Coconut, palm kernel, or palm oils. Regular salad dressings. Other Pickles and olives. Salted popcorn and pretzels. The items listed above may not be a complete list of foods and beverages to avoid. Contact your dietitian for more information. WHERE CAN I FIND MORE INFORMATION? National Heart, Lung, and Blood Institute: travelstabloid.com Document Released: 06/17/2011 Document  Revised: 11/12/2013 Document Reviewed: 05/02/2013 [facility] Patient Information 2015 [place], [place]. This information is not intended to replace advice given to you by your health care provider. Make sure you discuss any questions you have with your health care provider.        Why follow it? Research shows. . Those who follow the Mediterranean diet have a reduced risk of heart disease  . The diet is associated with a reduced incidence of Parkinson's and Alzheimer's diseases . People following the diet may have longer life expectancies and lower rates of chronic diseases  . The Dietary Guidelines for Americans recommends the Mediterranean diet as an eating plan to promote health and prevent disease  What Is the Mediterranean Diet?  . Healthy eating plan based on typical foods and recipes of Mediterranean-style cooking . The diet is primarily a plant based diet; these foods should make up a majority of meals   Starches - Plant based foods should make up a majority of meals - They are an important sources of vitamins, minerals, energy, antioxidants, and fiber - Choose whole grains, foods high in fiber and minimally processed items  - Typical grain sources include wheat, oats, barley, corn, brown rice, bulgar, farro, millet, polenta, couscous  - Various types of beans include chickpeas, lentils, fava beans, black beans, white  beans   Fruits  Veggies - Large quantities of antioxidant rich fruits & veggies; 6 or more servings  - Vegetables can be eaten raw or lightly drizzled with oil and cooked  - Vegetables common to the traditional Mediterranean Diet include: artichokes, arugula, beets, broccoli, brussel sprouts, cabbage, carrots, celery, collard greens, cucumbers, eggplant, kale, leeks, lemons, lettuce, mushrooms, okra, onions, peas, peppers, potatoes, pumpkin, radishes, rutabaga, shallots, spinach, sweet potatoes, turnips, zucchini - Fruits common to the Mediterranean Diet include: apples,  apricots, avocados, cherries, clementines, dates, figs, grapefruits, grapes, melons, nectarines, oranges, peaches, pears, pomegranates, strawberries, tangerines  Fats - Replace butter and margarine with healthy oils, such as olive oil, canola oil, and tahini  - Limit nuts to no more than a handful a day  - Nuts include walnuts, almonds, pecans, pistachios, pine nuts  - Limit or avoid candied, honey roasted or heavily salted nuts - Olives are central to the [employer] - can be eaten whole or used in a variety of dishes   Meats Protein - Limiting red meat: no more than a few times a month - When eating red meat: choose lean cuts and keep the portion to the size of deck of cards - Eggs: approx. 0 to 4 times a week  - Fish and lean poultry: at least 2 a week  - Healthy protein sources include, chicken, [place], lean beef, lamb - Increase intake of seafood such as tuna, salmon, trout, mackerel, shrimp, scallops - Avoid or limit high fat processed meats such as sausage and bacon  Dairy - Include moderate amounts of low fat dairy products  - Focus on healthy dairy such as fat free yogurt, skim milk, low or reduced fat cheese - Limit dairy products higher in fat such as whole or 2% milk, cheese, ice cream  Alcohol - Moderate amounts of red wine is ok  - No more than 5 oz daily for women (all ages) and men older than age 68  - No more than 10 oz of wine daily for men younger than 32  Other - Limit sweets and other desserts  - Use herbs and spices instead of salt to flavor foods  - Herbs and spices common to the traditional Mediterranean Diet include: basil, bay leaves, chives, cloves, cumin, fennel, garlic, lavender, marjoram, mint, oregano, parsley, pepper, rosemary, sage, savory, sumac, tarragon, thyme   It's not just a diet, it's a lifestyle:  . The Mediterranean diet includes lifestyle factors typical of those in the region  . Foods, drinks and meals are best eaten with others and  savored . Daily physical activity is important for overall good health . This could be strenuous exercise like running and aerobics . This could also be more leisurely activities such as walking, housework, yard-work, or taking the stairs . Moderation is the key; a balanced and healthy diet accommodates most foods and drinks . Consider portion sizes and frequency of consumption of certain foods   Meal Ideas & Options:  . Breakfast:  o Whole wheat toast or whole wheat English muffins with peanut butter & hard boiled egg o Steel cut oats topped with apples & cinnamon and skim milk  o Fresh fruit: banana, strawberries, melon, berries, peaches  o Smoothies: strawberries, bananas, greek yogurt, peanut butter o Low fat greek yogurt with blueberries and granola  o Egg white omelet with spinach and mushrooms o Breakfast couscous: whole wheat couscous, apricots, skim milk, cranberries  . Sandwiches:  o Hummus and grilled vegetables (peppers,  zucchini, squash) on whole wheat bread   o Grilled chicken on whole wheat pita with lettuce, tomatoes, cucumbers or tzatziki  o Tuna salad on whole wheat bread: tuna salad made with greek yogurt, olives, red peppers, capers, green onions o Garlic rosemary lamb pita: lamb sauted with garlic, rosemary, salt & pepper; add lettuce, cucumber, greek yogurt to pita - flavor with lemon juice and black pepper  . Seafood:  o Mediterranean grilled salmon, seasoned with garlic, basil, parsley, lemon juice and black pepper o Shrimp, lemon, and spinach whole-grain pasta salad made with low fat greek yogurt  o Seared scallops with lemon orzo  o Seared tuna steaks seasoned salt, pepper, coriander topped with tomato mixture of olives, tomatoes, olive oil, minced garlic, parsley, green onions and cappers  . Meats:  o Herbed greek chicken salad with kalamata olives, cucumber, feta  o Red bell peppers stuffed with spinach, bulgur, lean ground beef (or lentils) & topped with feta    o Kebabs: skewers of chicken, tomatoes, onions, zucchini, squash  o [place] burgers: made with red onions, mint, dill, lemon juice, feta cheese topped with roasted red peppers . Vegetarian o Cucumber salad: cucumbers, artichoke hearts, celery, red onion, feta cheese, tossed in olive oil & lemon juice  o Hummus and whole grain pita points with a greek salad (lettuce, tomato, feta, olives, cucumbers, red onion) o Lentil soup with celery, carrots made with vegetable broth, garlic, salt and pepper  o Tabouli salad: parsley, bulgur, mint, scallions, cucumbers, tomato, radishes, lemon juice, olive oil, salt and pepper.

## 2016-05-25 NOTE — Addendum Note (Signed)
Addended by: Carlena Hurl on: 05/25/2016 06:50 AM   Modules accepted: Orders

## 2016-11-22 ENCOUNTER — Other Ambulatory Visit: Payer: Self-pay | Admitting: Medical

## 2017-01-17 ENCOUNTER — Encounter: Payer: Self-pay | Admitting: Medical

## 2017-01-17 ENCOUNTER — Ambulatory Visit (INDEPENDENT_AMBULATORY_CARE_PROVIDER_SITE_OTHER): Payer: BLUE CROSS/BLUE SHIELD | Admitting: Medical

## 2017-01-17 VITALS — BP 132/80 | HR 90 | Wt 228.0 lb

## 2017-01-17 DIAGNOSIS — M25562 Pain in left knee: Secondary | ICD-10-CM

## 2017-01-17 DIAGNOSIS — B079 Viral wart, unspecified: Secondary | ICD-10-CM | POA: Diagnosis not present

## 2017-01-17 DIAGNOSIS — G8929 Other chronic pain: Secondary | ICD-10-CM | POA: Insufficient documentation

## 2017-01-17 DIAGNOSIS — M2141 Flat foot [pes planus] (acquired), right foot: Secondary | ICD-10-CM

## 2017-01-17 DIAGNOSIS — M2142 Flat foot [pes planus] (acquired), left foot: Secondary | ICD-10-CM
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
## 2017-01-17 NOTE — Addendum Note (Signed)
Addended by: Tyrone Apple on: 01/17/2017 10:52 AM   Modules accepted: Orders

## 2017-01-24 ENCOUNTER — Encounter (INDEPENDENT_AMBULATORY_CARE_PROVIDER_SITE_OTHER): Payer: Self-pay | Admitting: Orthopaedic Surgery

## 2017-01-24 ENCOUNTER — Ambulatory Visit (INDEPENDENT_AMBULATORY_CARE_PROVIDER_SITE_OTHER): Payer: Self-pay

## 2017-01-24 ENCOUNTER — Ambulatory Visit (INDEPENDENT_AMBULATORY_CARE_PROVIDER_SITE_OTHER): Payer: BLUE CROSS/BLUE SHIELD | Admitting: Orthopaedic Surgery

## 2017-01-24 DIAGNOSIS — M1712 Unilateral primary osteoarthritis, left knee: Secondary | ICD-10-CM

## 2017-01-24 MED ORDER — METHYLPREDNISOLONE ACETATE 40 MG/ML IJ SUSP
40.0000 mg | INTRAMUSCULAR | Status: AC | PRN
  Administered 2017-01-24: 40 mg via INTRA_ARTICULAR

## 2017-01-24 MED ORDER — LIDOCAINE HCL 1 % IJ SOLN
2.0000 mL | INTRAMUSCULAR | Status: AC | PRN
  Administered 2017-01-24: 2 mL

## 2017-01-24 MED ORDER — MELOXICAM 7.5 MG PO TABS: 7.5000 mg | ORAL_TABLET | Freq: Two times a day (BID) | ORAL | 2 refills | Status: DC | PRN

## 2017-01-24 MED ORDER — BUPIVACAINE HCL 0.5 % IJ SOLN
2.0000 mL | INTRAMUSCULAR | Status: AC | PRN
  Administered 2017-01-24: 2 mL via INTRA_ARTICULAR

## 2017-01-24 NOTE — Progress Notes (Signed)
Office Visit Note   Patient: Charles Rojas.           Date of Birth: 1970-10-06           MRN: [id number] Visit Date: 01/24/2017              Requested by: Carlena Hurl, PA-C [address] [place], [place] [id number] PCP: Carlena Hurl, PA-C   Assessment & Plan: Visit Diagnoses:  1. Primary osteoarthritis of left knee     Plan: Overall impression is osteoarthritis flareup. 5 mL of joint fluid was aspirated. Cortisone was injected. Meloxicam was prescribed. Continue over-the-counter meds as needed. Follow-up as needed.  Follow-Up Instructions: Return if symptoms worsen or fail to improve.   Orders:  Orders Placed This Encounter  Procedures  . XR KNEE 3 VIEW LEFT   Meds ordered this encounter  Medications  . meloxicam (MOBIC) 7.5 MG tablet    Sig: Take 1 tablet (7.5 mg total) by mouth 2 (two) times daily as needed for pain.    Dispense:  30 tablet    Refill:  2      Procedures: Large Joint Inj Date/Time: 01/24/2017 6:19 PM Performed by: Leandrew Koyanagi Authorized by: Leandrew Koyanagi   Consent Given by:  Patient Timeout: prior to procedure the correct patient, procedure, and site was verified   Indications:  Pain Location:  Knee Site:  L knee Prep: patient was prepped and draped in usual sterile fashion   Needle Size:  22 G Ultrasound Guidance: No   Fluoroscopic Guidance: No   Arthrogram: No   Medications:  2 mL lidocaine 1 %; 2 mL bupivacaine 0.5 %; 40 mg methylPREDNISolone acetate 40 MG/ML Aspirate amount (mL):  5 Patient tolerance:  Patient tolerated the procedure well with no immediate complications     Clinical Data: No additional findings.   Subjective: Chief Complaint  Patient presents with  . Left Knee - Pain    Patient is a 46 year old gentleman who comes in with left knee pain for 2 months. He denies any recent injuries. The pain is worse with prolonged sitting and squatting. The pain is mainly anterior and posterior. He wears a  knee brace occasionally and occasionally feels weak. He denies any significant mechanical symptoms or numbness and tingling. He uses over-the-counter rubs with partial relief. The pain is worse with climbing stairs.    Review of Systems  Constitutional: Negative.   All other systems reviewed and are negative.    Objective: Vital Signs: There were no vitals taken for this visit.  Physical Exam  Constitutional: He is oriented to person, place, and time. He appears well-developed and well-nourished.  HENT:  Head: Normocephalic and atraumatic.  Eyes: Pupils are equal, round, and reactive to light.  Neck: Neck supple.  Pulmonary/Chest: Effort normal.  Abdominal: Soft.  Musculoskeletal: Normal range of motion.  Neurological: He is alert and oriented to person, place, and time.  Skin: Skin is warm.  Psychiatric: He has a normal mood and affect. His behavior is normal. Judgment and thought content normal.  Nursing note and vitals reviewed.   Ortho Exam Left knee exam shows a small effusion. There is no skin changes. Collaterals and cruciate's are stable. No joint line tenderness. Specialty Comments:  No specialty comments available.  Imaging: Xr Knee 3 View Left  Result Date: 01/24/2017 Mild osteoarthritis    PMFS History: Patient Active Problem List   Diagnosis Date Noted  . Chronic pain of left knee 01/17/2017  .  Screening for prostate cancer 02/12/2016  . Elevated blood-pressure reading without diagnosis of hypertension 02/12/2016  . Obesity 02/12/2016  . Family history of prostate cancer in father 02/12/2016  . Encounter for health maintenance examination in adult 02/12/2016  . Genital herpes 02/12/2016  . Insomnia 02/12/2016  . Erectile dysfunction 02/12/2016  . Skin tag 02/12/2016   Past Medical History:  Diagnosis Date  . Erectile dysfunction   . Family history of prostate cancer   . H/O oral surgery   . Herpes    genital  . Insomnia   . Obesity       Family History  Problem Relation Age of Onset  . Cancer Father        prostate, age 52  . Hyperlipidemia Father   . Diabetes Mother   . Hypertension Mother   . Osteoarthritis Mother        Knees  . Cancer Paternal Uncle        prostate  . Cancer Maternal Uncle        thyroid  . Heart disease Neg Hx   . Stroke Neg Hx     Past Surgical History:  Procedure Laterality Date  . APPENDECTOMY    . MOUTH SURGERY     Social History   Occupational History  . Not on file.   Social History Main Topics  . Smoking status: Never Smoker  . Smokeless tobacco: Never Used  . Alcohol use Yes     Comment: rarely  . Drug use: No  . Sexual activity: Not on file

## 2017-04-06 ENCOUNTER — Other Ambulatory Visit (INDEPENDENT_AMBULATORY_CARE_PROVIDER_SITE_OTHER): Payer: Self-pay | Admitting: Orthopaedic Surgery

## 2017-06-06 ENCOUNTER — Other Ambulatory Visit: Payer: Self-pay | Admitting: Medical

## 2017-06-20 ENCOUNTER — Encounter: Payer: Self-pay | Admitting: Medical

## 2017-06-30 ENCOUNTER — Telehealth: Payer: Self-pay | Admitting: Medical

## 2017-06-30 ENCOUNTER — Encounter: Payer: Self-pay | Admitting: Medical

## 2017-06-30 NOTE — Telephone Encounter (Signed)
Pt called about his appt today at [facility] since someone called him stating that he missed his appt and asked if he could come in later today. Pt was not aware that his appt was today, he thought it was tomorrow. He apologized and said he was not able to rearrange his schedule to come in today. Rescheduled his appt.

## 2017-07-06 ENCOUNTER — Encounter: Payer: Self-pay | Admitting: Medical

## 2017-07-06 ENCOUNTER — Ambulatory Visit: Payer: BLUE CROSS/BLUE SHIELD | Admitting: Medical

## 2017-07-06 ENCOUNTER — Telehealth: Payer: Self-pay | Admitting: Medical

## 2017-07-06 VITALS — BP 148/88 | HR 80 | Wt 231.8 lb

## 2017-07-06 DIAGNOSIS — Z862 Personal history of diseases of the blood and blood-forming organs and certain disorders involving the immune mechanism: Secondary | ICD-10-CM | POA: Diagnosis not present

## 2017-07-06 DIAGNOSIS — R0683 Snoring: Secondary | ICD-10-CM

## 2017-07-06 DIAGNOSIS — I10 Essential (primary) hypertension: Secondary | ICD-10-CM

## 2017-07-06 DIAGNOSIS — E669 Obesity, unspecified: Secondary | ICD-10-CM | POA: Diagnosis not present

## 2017-07-06 DIAGNOSIS — Z1211 Encounter for screening for malignant neoplasm of colon: Secondary | ICD-10-CM | POA: Diagnosis not present

## 2017-07-06 DIAGNOSIS — N529 Male erectile dysfunction, unspecified: Secondary | ICD-10-CM | POA: Diagnosis not present

## 2017-07-06 DIAGNOSIS — L989 Disorder of the skin and subcutaneous tissue, unspecified: Secondary | ICD-10-CM

## 2017-07-06 DIAGNOSIS — A6000 Herpesviral infection of urogenital system, unspecified: Secondary | ICD-10-CM | POA: Diagnosis not present

## 2017-07-06 DIAGNOSIS — R0681 Apnea, not elsewhere classified: Secondary | ICD-10-CM | POA: Diagnosis not present

## 2017-07-06 LAB — POC HEMOCCULT BLD/STL (HOME/3-CARD/SCREEN)
Card #2 Fecal Occult Blod, POC: NEGATIVE
Card #3 Fecal Occult Blood, POC: NEGATIVE
Fecal Occult Blood, POC: NEGATIVE

## 2017-07-06 MED ORDER — VALACYCLOVIR HCL 1 G PO TABS
ORAL_TABLET | ORAL | 3 refills | Status: DC
Start: 2017-07-06 — End: 2018-08-18

## 2017-07-06 MED ORDER — AMLODIPINE BESYLATE 5 MG PO TABS: 5.0000 mg | ORAL_TABLET | Freq: Every day | ORAL | 3 refills | Status: DC

## 2017-07-06 NOTE — Addendum Note (Signed)
Addended by: Tyrone Apple on: 07/06/2017 11:48 AM   Modules accepted: Orders

## 2017-07-06 NOTE — Addendum Note (Signed)
Addended by: Tyrone Apple on: 07/06/2017 04:48 PM   Modules accepted: Orders

## 2017-07-06 NOTE — Telephone Encounter (Signed)
Refer to [facility] for sleep study

## 2017-07-06 NOTE — Addendum Note (Signed)
Addended by: Carlena Hurl on: 07/06/2017 01:04 PM   Modules accepted: Orders, Level of Service

## 2017-07-06 NOTE — Progress Notes (Signed)
Subjective: Chief Complaint  Patient presents with  . med check    med check , no concerns    Here for routine med check for hypertension, impaired glucose, anemia, genital herpes, erectile dysfunction, obesity.  He is compliant with amlodipine 5 mg daily.  Home blood pressure readings are generally within normal although his reading is a little high today  His girlfriend has a concern about sleep apnea.  He apparently snores quite loud, and she has noticed him quit breathing and sometimes while sleeping he wake up abruptly with unusual breathing.  She asked him to discuss getting a sleep study  He works 2 jobs, on average gets 5 hours of sleep nightly.  He still has occasional problems with erectile dysfunction but does not want a refill medication   uses Valtrex as needed for outbreak  He is not fasting today  He has several skin tags of his neck, but has an area on the back of his neck that has grown out and getting bigger over the last few months  Past Medical History:  Diagnosis Date  . Erectile dysfunction   . Family history of prostate cancer   . H/O oral surgery   . Herpes    genital  . Insomnia   . Obesity    Current Outpatient Medications on File Prior to Visit  Medication Sig Dispense Refill  . amLODipine (NORVASC) 5 MG tablet TAKE 1 TABLET BY MOUTH DAILY. 30 tablet 0  . sildenafil (VIAGRA) 50 MG tablet Take 1 tablet (50 mg total) by mouth daily as needed for erectile dysfunction. 10 tablet 5  . valACYclovir (VALTREX) 1000 MG tablet 2 tablets po BID x 1 day for flare up 20 tablet 2  . meloxicam (MOBIC) 7.5 MG tablet TAKE 1 TABLET TWICE A DAY AS NEEDED FOR PAIN (Patient not taking: Reported on 07/06/2017) 30 tablet 2  . [DISCONTINUED] eszopiclone (LUNESTA) 2 MG TABS Take 1 tablet (2 mg total) by mouth at bedtime. Take immediately before bedtime 20 tablet 1   No current facility-administered medications on file prior to visit.    ROS as in  subjective   Objective BP (!) 148/88   Pulse 80   Wt 231 lb 12.8 oz (105.1 kg)   SpO2 98%   BMI 32.10 kg/m   General appearance: alert, no distress, WD/WN,  Oral cavity: MMM, no lesions Neck: supple, no lymphadenopathy, no thyromegaly, no masses, no bruits Heart: RRR, normal S1, S2, no murmurs Lungs: CTA bilaterally, no wheezes, rhonchi, or rales Ext: no edema Pulses: 2+ symmetric, upper and lower extremities, normal cap refill Neuro: CN2-12 intact, non focal exam Skin: Numerous pedunculated skin tags along the neck throughout posterior neck, with hornlike lesion, 4 mm base, 2 mm diameter along the stalk, and raised up to approximately 4 mm length somewhat thickened lesion    Assessment: Encounter Diagnoses  Name Primary?  . Essential hypertension, benign Yes  . History of anemia   . Screen for colon cancer   . Genital herpes simplex, unspecified site   . Erectile dysfunction, unspecified erectile dysfunction type   . Obesity with serious comorbidity, unspecified classification, unspecified obesity type   . Changing skin lesion   . Snoring   . Witnessed apneic spells      Plan: He will return later this week fasting for labs.  Hypertension- continue amlodipine 5 mg daily, continue home monitoring, discussed need for weight loss and healthier lifestyle changes  snoring, witnessed apnea spells-.  Referral for  sleep study.  Discussed possible related diagnoses to sleep apnea including high blood pressure.  again reiterated the need to lose weight   Hx/o anemia, Screen for colon cancer will review fecal occult blood stool cards   Obesity- counseled on need to lose weight and lifestyle changes  Genital herpes-continue Valtrex as needed for outbreak  Separate issue today/procedure: Changing skin lesion- we discussed the findings and recommended biopsy.  Discussed risk and benefits of procedure.  Cleaned and prepped area in usual sterile process.  Used 1% lidocaine with  epinephrine,0.5 cc for local anesthesia.  I used sterile scissors to remove the entire skin lesion of posterior neck.  Use direct pressure and silver nitrate stick for hemostasis.  Covered wound with clean bandage.  Discussed wound care lesion sent for pathology.  Dickson was seen today for med check.  Diagnoses and all orders for this visit:  Essential hypertension, benign -     Comprehensive metabolic panel; Future -     CBC with Differential/Platelet; Future -     Lipid panel; Future -     Hemoglobin A1c; Future  History of anemia -     CBC with Differential/Platelet; Future  Screen for colon cancer -     POC Hemoccult Bld/Stl (3-Cd Home Screen); Future -     POC Hemoccult Bld/Stl (3-Cd Home Screen) -     CBC with Differential/Platelet; Future  Genital herpes simplex, unspecified site  Erectile dysfunction, unspecified erectile dysfunction type  Obesity with serious comorbidity, unspecified classification, unspecified obesity type -     Comprehensive metabolic panel; Future -     Lipid panel; Future -     Hemoglobin A1c; Future  Changing skin lesion -     CBC with Differential/Platelet; Future  Snoring  Witnessed apneic spells

## 2017-07-06 NOTE — Telephone Encounter (Signed)
Sent referral 

## 2017-07-07 ENCOUNTER — Other Ambulatory Visit: Payer: BLUE CROSS/BLUE SHIELD

## 2017-07-07 DIAGNOSIS — Z862 Personal history of diseases of the blood and blood-forming organs and certain disorders involving the immune mechanism: Secondary | ICD-10-CM

## 2017-07-07 DIAGNOSIS — L989 Disorder of the skin and subcutaneous tissue, unspecified: Secondary | ICD-10-CM

## 2017-07-07 DIAGNOSIS — E669 Obesity, unspecified: Secondary | ICD-10-CM

## 2017-07-07 DIAGNOSIS — Z1211 Encounter for screening for malignant neoplasm of colon: Secondary | ICD-10-CM

## 2017-07-07 DIAGNOSIS — I10 Essential (primary) hypertension: Secondary | ICD-10-CM

## 2017-07-08 ENCOUNTER — Other Ambulatory Visit: Payer: Self-pay | Admitting: Medical

## 2017-07-08 ENCOUNTER — Other Ambulatory Visit: Payer: Self-pay

## 2017-07-08 LAB — COMPREHENSIVE METABOLIC PANEL
AG Ratio: 1.6 (calc) (ref 1.0–2.5)
ALT: 18 U/L (ref 9–46)
AST: 15 U/L (ref 10–40)
Albumin: 4.1 g/dL (ref 3.6–5.1)
Alkaline phosphatase (APISO): 63 U/L (ref 40–115)
BUN: 13 mg/dL (ref 7–25)
CO2: 27 mmol/L (ref 20–32)
Calcium: 9.6 mg/dL (ref 8.6–10.3)
Chloride: 105 mmol/L (ref 98–110)
Creat: 0.89 mg/dL (ref 0.60–1.35)
Globulin: 2.6 g/dL (calc) (ref 1.9–3.7)
Glucose, Bld: 89 mg/dL (ref 65–99)
Potassium: 3.9 mmol/L (ref 3.5–5.3)
Sodium: 140 mmol/L (ref 135–146)
Total Bilirubin: 0.4 mg/dL (ref 0.2–1.2)
Total Protein: 6.7 g/dL (ref 6.1–8.1)

## 2017-07-08 LAB — CBC WITH DIFFERENTIAL/PLATELET
Basophils Absolute: 38 cells/uL (ref 0–200)
Basophils Relative: 0.5 %
Eosinophils Absolute: 68 cells/uL (ref 15–500)
Eosinophils Relative: 0.9 %
HCT: 41 % (ref 38.5–50.0)
Hemoglobin: 13.2 g/dL (ref 13.2–17.1)
Lymphs Abs: 3169 cells/uL (ref 850–3900)
MCH: 23.7 pg — ABNORMAL LOW (ref 27.0–33.0)
MCHC: 32.2 g/dL (ref 32.0–36.0)
MCV: 73.7 fL — ABNORMAL LOW (ref 80.0–100.0)
MPV: 9.5 fL (ref 7.5–12.5)
Monocytes Relative: 12.3 %
Neutro Abs: 3390 cells/uL (ref 1500–7800)
Neutrophils Relative %: 44.6 %
Platelets: 353 10*3/uL (ref 140–400)
RBC: 5.56 10*6/uL (ref 4.20–5.80)
RDW: 13.8 % (ref 11.0–15.0)
Total Lymphocyte: 41.7 %
WBC mixed population: 935 cells/uL (ref 200–950)
WBC: 7.6 10*3/uL (ref 3.8–10.8)

## 2017-07-08 LAB — HEMOGLOBIN A1C
Hgb A1c MFr Bld: 6 % of total Hgb — ABNORMAL HIGH (ref <5.7)
Mean Plasma Glucose: 126 (calc)
eAG (mmol/L): 7 (calc)

## 2017-07-08 LAB — LIPID PANEL
Cholesterol: 203 mg/dL — ABNORMAL HIGH (ref <200)
HDL: 45 mg/dL (ref >40)
LDL Cholesterol (Calc): 132 mg/dL (calc) — ABNORMAL HIGH
Non-HDL Cholesterol (Calc): 158 mg/dL (calc) — ABNORMAL HIGH (ref <130)
Total CHOL/HDL Ratio: 4.5 (calc) (ref <5.0)
Triglycerides: 144 mg/dL (ref <150)

## 2017-07-08 MED ORDER — PRAVASTATIN SODIUM 20 MG PO TABS: 20.0000 mg | ORAL_TABLET | Freq: Every evening | ORAL | 0 refills | Status: DC

## 2017-07-11 ENCOUNTER — Telehealth: Payer: Self-pay | Admitting: Medical

## 2017-07-11 NOTE — Telephone Encounter (Signed)
The biopsy from the skin lesion last week was a wart, no cancerous findings.

## 2017-07-11 NOTE — Telephone Encounter (Signed)
Pt was notified of results

## 2017-07-12 DIAGNOSIS — N200 Calculus of kidney: Secondary | ICD-10-CM

## 2017-07-12 DIAGNOSIS — G4733 Obstructive sleep apnea (adult) (pediatric): Secondary | ICD-10-CM

## 2017-07-12 HISTORY — DX: Calculus of kidney: N20.0

## 2017-07-12 HISTORY — DX: Obstructive sleep apnea (adult) (pediatric): G47.33

## 2017-07-19 ENCOUNTER — Encounter: Payer: Self-pay | Admitting: Medical

## 2017-08-05 ENCOUNTER — Ambulatory Visit (HOSPITAL_BASED_OUTPATIENT_CLINIC_OR_DEPARTMENT_OTHER): Payer: BLUE CROSS/BLUE SHIELD | Attending: Medical | Admitting: Internal Medicine

## 2017-08-05 VITALS — Ht 72.0 in | Wt 230.0 lb

## 2017-08-05 DIAGNOSIS — Z79899 Other long term (current) drug therapy: Secondary | ICD-10-CM | POA: Diagnosis not present

## 2017-08-05 DIAGNOSIS — G4733 Obstructive sleep apnea (adult) (pediatric): Secondary | ICD-10-CM | POA: Insufficient documentation

## 2017-08-05 DIAGNOSIS — R0683 Snoring: Secondary | ICD-10-CM | POA: Insufficient documentation

## 2017-08-13 DIAGNOSIS — R0683 Snoring: Secondary | ICD-10-CM

## 2017-08-13 NOTE — Procedures (Signed)
Patient Name: Charles Rojas, Bulger Date: 08/05/2017 Gender: Male D.O.B: 06/07/1971 Age (years): 46 Referring Provider: Chana Bode Height (inches): 33 Interpreting Physician: Baird Lyons MD, ABSM Weight (lbs): 230 RPSGT: Lanae Boast BMI: 31 MRN: [id number] Neck Size: 17.00 <br> <br> CLINICAL INFORMATION Sleep Study Type: Split Night CPAP  Indication for sleep study: Snoring  Epworth Sleepiness Score: 5  SLEEP STUDY TECHNIQUE As per the AASM Manual for the Scoring of Sleep and Associated Events v2.3 (April 2016) with a hypopnea requiring 4% desaturations.  The channels recorded and monitored were frontal, central and occipital EEG, electrooculogram (EOG), submentalis EMG (chin), nasal and oral airflow, thoracic and abdominal wall motion, anterior tibialis EMG, snore microphone, electrocardiogram, and pulse oximetry. Continuous positive airway pressure (CPAP) was initiated when the patient met split night criteria and was titrated according to treat sleep-disordered breathing.  MEDICATIONS Medications self-administered by patient taken the night of the study : provastatin  RESPIRATORY PARAMETERS Diagnostic  Total AHI (/hr): 69.2 RDI (/hr): 72.5 OA Index (/hr): 27.1 CA Index (/hr): 0.0 REM AHI (/hr): N/A NREM AHI (/hr): 69.2 Supine AHI (/hr): 69.2 Non-supine AHI (/hr): N/A Min O2 Sat (%): 81.00 Mean O2 (%): 94.55 Time below 88% (min): 4.1   Titration  Optimal Pressure (cm): 11 AHI at Optimal Pressure (/hr): 0.9 Min O2 at Optimal Pressure (%): 95.0 Supine % at Optimal (%): 100 Sleep % at Optimal (%): 97   SLEEP ARCHITECTURE The recording time for the entire night was 396.9 minutes.  During a baseline period of 193.8 minutes, the patient slept for 88.5 minutes in REM and nonREM, yielding a sleep efficiency of 45.7%. Sleep onset after lights out was 33.7 minutes with a REM latency of N/A minutes. The patient spent 45.76% of the night in stage N1 sleep,  54.24% in stage N2 sleep, 0.00% in stage N3 and 0.00% in REM.  During the titration period of 197.6 minutes, the patient slept for 184.0 minutes in REM and nonREM, yielding a sleep efficiency of 93.1%. Sleep onset after CPAP initiation was 2.4 minutes with a REM latency of 35.5 minutes. The patient spent 9.24% of the night in stage N1 sleep, 66.85% in stage N2 sleep, 0.00% in stage N3 and 23.91% in REM.  CARDIAC DATA The 2 lead EKG demonstrated sinus rhythm. The mean heart rate was 69.35 beats per minute. Other EKG findings include: None.  LEG MOVEMENT DATA The total Periodic Limb Movements of Sleep (PLMS) were 0. The PLMS index was 0.00 .  IMPRESSIONS - Severe obstructive sleep apnea occurred during the diagnostic portion of the study (AHI = 69.2/hour). An optimal PAP pressure was selected for this patient ( 11 cm of water) - No significant central sleep apnea occurred during the diagnostic portion of the study (CAI = 0.0/hour). - Moderate oxygen desaturation was noted during the diagnostic portion of the study (Min O2 =81.00%). - The patient snored with loud snoring volume during the diagnostic portion of the study. - No cardiac abnormalities were noted during this study. - Clinically significant periodic limb movements did not occur during sleep.  DIAGNOSIS - Obstructive Sleep Apnea (327.23 [G47.33 ICD-10])  RECOMMENDATIONS - Trial of CPAP therapy on 11 cm H2O. Patient used a Medium size Resmed Full Face Mask AirFit F20 mask and heated humidification. - Be careful with alcohol, sedatives and other CNS depressants that may worsen sleep apnea and disrupt normal sleep architecture. - Sleep hygiene should be reviewed to assess factors that may improve sleep quality. - Weight management  and regular exercise should be initiated or continued.  [Electronically signed] 08/13/2017 03:52 PM  Baird Lyons MD, [place], American Board of Sleep Medicine   NPI: [id number]                          [place]  ELECTRONICALLY SIGNED ON:  08/13/2017, 3:50 PM [place] PH: [phone]   FX: [phone] [place]

## 2017-08-19 ENCOUNTER — Encounter: Payer: Self-pay | Admitting: Medical

## 2017-08-23 ENCOUNTER — Ambulatory Visit: Payer: BLUE CROSS/BLUE SHIELD | Admitting: Medical

## 2017-08-23 ENCOUNTER — Encounter: Payer: Self-pay | Admitting: Medical

## 2017-08-23 VITALS — BP 140/86 | HR 87 | Wt 233.2 lb

## 2017-08-23 DIAGNOSIS — G4733 Obstructive sleep apnea (adult) (pediatric): Secondary | ICD-10-CM | POA: Diagnosis not present

## 2017-08-23 DIAGNOSIS — R0683 Snoring: Secondary | ICD-10-CM

## 2017-08-23 DIAGNOSIS — I10 Essential (primary) hypertension: Secondary | ICD-10-CM | POA: Diagnosis not present

## 2017-08-23 DIAGNOSIS — R0681 Apnea, not elsewhere classified: Secondary | ICD-10-CM

## 2017-08-23 NOTE — Progress Notes (Signed)
Subjective: Chief Complaint  Patient presents with  . discuss sleep study    discuss sleep study    Here to discuss recent sleep study.    He is compliant with amlodipine 5 mg daily.  Home blood pressure readings are generally within normal although his reading is a little high today.  Just came from the dentist.   His girlfriend has a concern about sleep apnea.  He apparently snores quite loud, and she has noticed him quit breathing and sometimes while sleeping he wake up abruptly with unusual breathing.  He works 2 jobs, on average gets 5 hours of sleep nightly.   Past Medical History:  Diagnosis Date  . Erectile dysfunction   . Family history of prostate cancer   . H/O oral surgery   . Herpes    genital  . Insomnia   . Obesity    Current Outpatient Medications on File Prior to Visit  Medication Sig Dispense Refill  . amLODipine (NORVASC) 5 MG tablet Take 1 tablet (5 mg total) by mouth daily. 90 tablet 3  . meloxicam (MOBIC) 7.5 MG tablet TAKE 1 TABLET TWICE A DAY AS NEEDED FOR PAIN 30 tablet 2  . pravastatin (PRAVACHOL) 20 MG tablet Take 1 tablet (20 mg total) by mouth every evening. 90 tablet 0  . sildenafil (VIAGRA) 50 MG tablet Take 1 tablet (50 mg total) by mouth daily as needed for erectile dysfunction. 10 tablet 5  . valACYclovir (VALTREX) 1000 MG tablet 2 tablets po BID x 1 day for flare up 20 tablet 3  . [DISCONTINUED] eszopiclone (LUNESTA) 2 MG TABS Take 1 tablet (2 mg total) by mouth at bedtime. Take immediately before bedtime 20 tablet 1   No current facility-administered medications on file prior to visit.    ROS as in subjective   Objective BP 140/86   Pulse 87   Wt 233 lb 3.2 oz (105.8 kg)   SpO2 98%   BMI 31.63 kg/m   General appearance: alert, no distress, WD/WN,    Assessment: Encounter Diagnoses  Name Primary?  . Essential hypertension, benign Yes  . Snoring   . Witnessed apneic spells   . OSA (obstructive sleep apnea)      Plan: OSA,  snoring, witnessed apnea spells- reviewed the recent sleep study results showing severe OSA and desaturation.   Counseled on diet, exercise, need for weight loss.  Referral for CPAP.   F/u in 4-6 wk after starting CPAP.  Hypertension- continue amlodipine 5 mg daily, continue home monitoring, discussed need for weight loss and healthier lifestyle changes  Obesity- counseled on need to lose weight and lifestyle changes  Charles Rojas was seen today for discuss sleep study.  Diagnoses and all orders for this visit:  Essential hypertension, benign  Snoring  Witnessed apneic spells  OSA (obstructive sleep apnea)

## 2017-08-26 NOTE — Addendum Note (Signed)
Addended by: Tyrone Apple on: 08/26/2017 01:21 PM   Modules accepted: Orders

## 2017-09-12 ENCOUNTER — Ambulatory Visit: Payer: Self-pay | Admitting: Medical

## 2018-05-22 ENCOUNTER — Encounter (HOSPITAL_COMMUNITY): Payer: Self-pay | Admitting: Emergency Medicine

## 2018-05-22 ENCOUNTER — Ambulatory Visit (HOSPITAL_COMMUNITY)
Admission: EM | Admit: 2018-05-22 | Discharge: 2018-05-22 | Disposition: A | Discharge status: Home / Self Care | Payer: BLUE CROSS/BLUE SHIELD | Attending: Family Medicine | Admitting: Family Medicine

## 2018-05-22 ENCOUNTER — Other Ambulatory Visit: Payer: Self-pay

## 2018-05-22 DIAGNOSIS — Z79899 Other long term (current) drug therapy: Secondary | ICD-10-CM | POA: Insufficient documentation

## 2018-05-22 DIAGNOSIS — G4700 Insomnia, unspecified: Secondary | ICD-10-CM | POA: Diagnosis not present

## 2018-05-22 DIAGNOSIS — D649 Anemia, unspecified: Secondary | ICD-10-CM | POA: Insufficient documentation

## 2018-05-22 DIAGNOSIS — R319 Hematuria, unspecified: Secondary | ICD-10-CM | POA: Diagnosis not present

## 2018-05-22 DIAGNOSIS — M549 Dorsalgia, unspecified: Secondary | ICD-10-CM | POA: Diagnosis present

## 2018-05-22 DIAGNOSIS — M25562 Pain in left knee: Secondary | ICD-10-CM | POA: Diagnosis not present

## 2018-05-22 DIAGNOSIS — E669 Obesity, unspecified: Secondary | ICD-10-CM | POA: Insufficient documentation

## 2018-05-22 DIAGNOSIS — G4733 Obstructive sleep apnea (adult) (pediatric): Secondary | ICD-10-CM | POA: Insufficient documentation

## 2018-05-22 DIAGNOSIS — I10 Essential (primary) hypertension: Secondary | ICD-10-CM | POA: Diagnosis not present

## 2018-05-22 DIAGNOSIS — Z8249 Family history of ischemic heart disease and other diseases of the circulatory system: Secondary | ICD-10-CM | POA: Diagnosis not present

## 2018-05-22 DIAGNOSIS — N23 Unspecified renal colic: Secondary | ICD-10-CM

## 2018-05-22 DIAGNOSIS — M545 Low back pain: Secondary | ICD-10-CM | POA: Diagnosis not present

## 2018-05-22 DIAGNOSIS — Z8042 Family history of malignant neoplasm of prostate: Secondary | ICD-10-CM | POA: Diagnosis not present

## 2018-05-22 DIAGNOSIS — G8929 Other chronic pain: Secondary | ICD-10-CM | POA: Diagnosis not present

## 2018-05-22 LAB — POCT URINALYSIS DIP (DEVICE)
Bilirubin Urine: NEGATIVE
Glucose, UA: NEGATIVE mg/dL
Ketones, ur: NEGATIVE mg/dL
Leukocytes, UA: NEGATIVE
Nitrite: POSITIVE — AB
Protein, ur: NEGATIVE mg/dL
Specific Gravity, Urine: >=1.03 (ref 1.005–1.030)
Urobilinogen, UA: 0.2 mg/dL (ref 0.0–1.0)
pH: 5.5 (ref 5.0–8.0)

## 2018-05-22 MED ORDER — HYDROCODONE-ACETAMINOPHEN 5-325 MG PO TABS: 2.0000 | ORAL_TABLET | ORAL | 0 refills | Status: DC | PRN

## 2018-05-22 MED ORDER — KETOROLAC TROMETHAMINE 60 MG/2ML IM SOLN
INTRAMUSCULAR | Status: AC
  Filled 2018-05-22: qty 2

## 2018-05-22 MED ORDER — KETOROLAC TROMETHAMINE 60 MG/2ML IM SOLN
60.0000 mg | Freq: Once | INTRAMUSCULAR | Status: AC
  Administered 2018-05-22: 60 mg via INTRAMUSCULAR

## 2018-05-22 NOTE — ED Notes (Signed)
Notified hallie, pa of patient and pain

## 2018-05-22 NOTE — Discharge Instructions (Addendum)
Call if you develop fever, pain unrelieved by medicine or urinary retention

## 2018-05-22 NOTE — ED Triage Notes (Signed)
Left lower back/flank pain that started around 3 pm today.    Though he may have saw blood in urine.

## 2018-05-22 NOTE — ED Provider Notes (Signed)
[facility]    CSN: [id number] Arrival date & time: 05/22/18  1702     History   Chief Complaint Chief Complaint  Patient presents with  . Back Pain    HPI Canon Marcellous Rojas. is a 47 y.o. male.   Sudden onset today of left flank pain.  Patient thinks he saw blood in urine.  He is very restless and unable to get relief.  No history of kidney stones in himself or family.  Movement does not affect pain or make it worse.  He denies nausea or vomiting.  Denies abdominal pain or testicular pain  HPI  Past Medical History:  Diagnosis Date  . Erectile dysfunction   . Family history of prostate cancer   . H/O oral surgery   . Herpes    genital  . Insomnia   . Obesity     Patient Active Problem List   Diagnosis Date Noted  . OSA (obstructive sleep apnea) 08/23/2017  . Screen for colon cancer 07/06/2017  . Essential hypertension, benign 07/06/2017  . History of anemia 07/06/2017  . Changing skin lesion 07/06/2017  . Snoring 07/06/2017  . Witnessed apneic spells 07/06/2017  . Chronic pain of left knee 01/17/2017  . Screening for prostate cancer 02/12/2016  . Obesity 02/12/2016  . Family history of prostate cancer in father 02/12/2016  . Encounter for health maintenance examination in adult 02/12/2016  . Genital herpes 02/12/2016  . Insomnia 02/12/2016  . Erectile dysfunction 02/12/2016  . Skin tag 02/12/2016    Past Surgical History:  Procedure Laterality Date  . APPENDECTOMY    . MOUTH SURGERY         Home Medications    Prior to Admission medications   Medication Sig Start Date End Date Taking? Authorizing Provider  cyclobenzaprine (FLEXERIL) 10 MG tablet Take 10 mg by mouth 3 (three) times daily as needed for muscle spasms.   Yes [provider]  naproxen sodium (ALEVE) 220 MG tablet Take 220 mg by mouth.   Yes [provider]  amLODipine (NORVASC) 5 MG tablet Take 1 tablet (5 mg total) by mouth daily. 07/06/17   Tysinger, Camelia Eng, PA-C  meloxicam (MOBIC) 7.5 MG tablet TAKE 1 TABLET TWICE A DAY AS NEEDED FOR PAIN 04/06/17   Leandrew Koyanagi, MD  pravastatin (PRAVACHOL) 20 MG tablet Take 1 tablet (20 mg total) by mouth every evening. 07/08/17 07/08/18  Tysinger, Camelia Eng, PA-C  sildenafil (VIAGRA) 50 MG tablet Take 1 tablet (50 mg total) by mouth daily as needed for erectile dysfunction. 02/12/16   Tysinger, Camelia Eng, PA-C  valACYclovir (VALTREX) 1000 MG tablet 2 tablets po BID x 1 day for flare up 07/06/17   Tysinger, Camelia Eng, PA-C  eszopiclone (LUNESTA) 2 MG TABS Take 1 tablet (2 mg total) by mouth at bedtime. Take immediately before bedtime 07/26/12 11/15/14  Tysinger, Camelia Eng, PA-C    Family History Family History  Problem Relation Age of Onset  . Cancer Father        prostate, age 62  . Hyperlipidemia Father   . Diabetes Mother   . Hypertension Mother   . Osteoarthritis Mother        Knees  . Cancer Paternal Uncle        prostate  . Cancer Maternal Uncle        thyroid  . Heart disease Neg Hx   . Stroke Neg Hx     Social History Social History  Tobacco Use  . Smoking status: Never Smoker  . Smokeless tobacco: Never Used  Substance Use Topics  . Alcohol use: Yes    Comment: rarely  . Drug use: No     Allergies   Patient has no known allergies.   Review of Systems Review of Systems  Constitutional: Negative for chills and fever.  HENT: Negative for ear pain and sore throat.   Eyes: Negative for pain and visual disturbance.  Respiratory: Negative for cough and shortness of breath.   Cardiovascular: Negative for chest pain and palpitations.  Gastrointestinal: Negative for abdominal pain and vomiting.  Genitourinary: Negative for dysuria and hematuria.  Musculoskeletal: Negative for arthralgias.  Skin: Negative for color change and rash.  Neurological: Negative for seizures and syncope.  All other systems reviewed and are negative.    Physical Exam Triage Vital Signs ED Triage Vitals  Enc  Vitals Group     BP 05/22/18 1807 126/68     Pulse Rate 05/22/18 1807 89     Resp 05/22/18 1807 18     Temp 05/22/18 1807 (!) 97.1 F (36.2 C)     Temp Source 05/22/18 1807 Oral     SpO2 05/22/18 1807 100 %     Weight --      Height --      Head Circumference --      Peak Flow --      Pain Score 05/22/18 1804 10     Pain Loc --      Pain Edu? --      Excl. in [place]? --    No data found.  Updated Vital Signs BP 126/68 (BP Location: Left Arm)   Pulse 89   Temp (!) 97.1 F (36.2 C) (Oral)   Resp 18   SpO2 100%   Visual Acuity Right Eye Distance:   Left Eye Distance:   Bilateral Distance:    Right Eye Near:   Left Eye Near:    Bilateral Near:     Physical Exam  Constitutional: He appears well-developed and well-nourished.  Cardiovascular: Normal rate and regular rhythm.  Pulmonary/Chest: Effort normal and breath sounds normal.  Abdominal: Soft. Bowel sounds are normal.  Nursing note and vitals reviewed.    UC Treatments / Results  Labs (all labs ordered are listed, but only abnormal results are displayed) Labs Reviewed  POCT URINALYSIS DIP (DEVICE) - Abnormal; Notable for the following components:      Result Value   Hgb urine dipstick MODERATE (*)    Nitrite POSITIVE (*)    All other components within normal limits    EKG None  Radiology No results found.  Procedures Procedures (including critical care time)  Medications Ordered in UC Medications  ketorolac (TORADOL) injection 60 mg (has no administration in time range)    Initial Impression / Assessment and Plan / UC Course  I have reviewed the triage vital signs and the nursing notes.  Pertinent labs & imaging results that were available during my care of the patient were reviewed by me and considered in my medical decision making (see chart for details).     Probable kidney stone.  This fits with clinical picture.  However patient does have nitrite in urine as well as blood.  Urine should be  cultured.  I have asked him to push fluids strain urine. Final Clinical Impressions(s) / UC Diagnoses   Final diagnoses:  None   Discharge Instructions   None    ED Prescriptions  None     Controlled Substance Prescriptions [place] Controlled Substance Registry consulted? No   Wardell Honour, MD 05/22/18 [phone]

## 2018-05-24 LAB — URINE CULTURE: Culture: NO GROWTH

## 2018-06-17 ENCOUNTER — Ambulatory Visit: Payer: BLUE CROSS/BLUE SHIELD | Admitting: Family Medicine

## 2018-06-17 ENCOUNTER — Encounter: Payer: Self-pay | Admitting: Family Medicine

## 2018-06-17 ENCOUNTER — Other Ambulatory Visit: Payer: Self-pay

## 2018-06-17 VITALS — BP 121/76 | HR 93 | Temp 98.0°F | Resp 16 | Wt 234.0 lb

## 2018-06-17 DIAGNOSIS — R05 Cough: Secondary | ICD-10-CM

## 2018-06-17 DIAGNOSIS — J0100 Acute maxillary sinusitis, unspecified: Secondary | ICD-10-CM

## 2018-06-17 DIAGNOSIS — R059 Cough, unspecified: Secondary | ICD-10-CM

## 2018-06-17 MED ORDER — BENZONATATE 100 MG PO CAPS: 100.0000 mg | ORAL_CAPSULE | Freq: Three times a day (TID) | ORAL | 0 refills | Status: DC | PRN

## 2018-06-17 MED ORDER — AMOXICILLIN-POT CLAVULANATE 875-125 MG PO TABS: 1.0000 | ORAL_TABLET | Freq: Two times a day (BID) | ORAL | 0 refills | Status: DC

## 2018-06-17 NOTE — Patient Instructions (Addendum)
   If you have lab work done today you will be contacted with your lab results within the next 2 weeks.  If you have not heard from us then please contact us. The fastest way to get your results is to register for My Chart.   IF you received an x-ray today, you will receive an invoice from [facility]. Please contact [facility] at [phone] with questions or concerns regarding your invoice.   IF you received labwork today, you will receive an invoice from [facility]. Please contact [facility] at [phone] with questions or concerns regarding your invoice.   Our billing staff will not be able to assist you with questions regarding bills from these companies.  You will be contacted with the lab results as soon as they are available. The fastest way to get your results is to activate your My Chart account. Instructions are located on the last page of this paperwork. If you have not heard from us regarding the results in 2 weeks, please contact this office.     Sinusitis, Adult Sinusitis is soreness and inflammation of your sinuses. Sinuses are hollow spaces in the bones around your face. They are located:  Around your eyes.  In the middle of your forehead.  Behind your nose.  In your cheekbones.  Your sinuses and nasal passages are lined with a stringy fluid (mucus). Mucus normally drains out of your sinuses. When your nasal tissues get inflamed or swollen, the mucus can get trapped or blocked so air cannot flow through your sinuses. This lets bacteria, viruses, and funguses grow, and that leads to infection. Follow these instructions at home: Medicines  Take, use, or apply over-the-counter and prescription medicines only as told by your doctor. These may include nasal sprays.  If you were prescribed an antibiotic medicine, take it as told by your doctor. Do not stop taking the antibiotic even if you start to feel better. Hydrate and Humidify  Drink enough  water to keep your pee (urine) clear or pale yellow.  Use a cool mist humidifier to keep the humidity level in your home above 50%.  Breathe in steam for 10-15 minutes, 3-4 times a day or as told by your doctor. You can do this in the bathroom while a hot shower is running.  Try not to spend time in cool or dry air. Rest  Rest as much as possible.  Sleep with your head raised (elevated).  Make sure to get enough sleep each night. General instructions  Put a warm, moist washcloth on your face 3-4 times a day or as told by your doctor. This will help with discomfort.  Wash your hands often with soap and water. If there is no soap and water, use hand sanitizer.  Do not smoke. Avoid being around people who are smoking (secondhand smoke).  Keep all follow-up visits as told by your doctor. This is important. Contact a doctor if:  You have a fever.  Your symptoms get worse.  Your symptoms do not get better within 10 days. Get help right away if:  You have a very bad headache.  You cannot stop throwing up (vomiting).  You have pain or swelling around your face or eyes.  You have trouble seeing.  You feel confused.  Your neck is stiff.  You have trouble breathing. This information is not intended to replace advice given to you by your health care provider. Make sure you discuss any questions you have with your health   care provider. Document Released: 12/15/2007 Document Revised: 02/22/2016 Document Reviewed: 04/23/2015 Elsevier Interactive Patient Education  2018 [employer] Inc[employer]  

## 2018-06-17 NOTE — Progress Notes (Signed)
Patient ID: Charles Carwin., male    DOB: 02/17/1971, 47 y.o.   MRN: [id number]  PCP: Carlena Hurl, PA-C  Chief Complaint  Patient presents with  . Sinus Problem    pressure and drainage x 4 days     Subjective:  HPI Charles Aubry Tucholski. is a 47 y.o. male presents for evaluation sinus pressure and cough x 7 days. Complains of facial pressure, congestions, nasal drainage and productive cough. No fever or chills. Denies GI symptoms. Has taken several over the counter preparations without relief of symptoms. Recent sick contact, spouse with similar symptoms. Social History   Socioeconomic History  . Marital status: Legally Separated    Spouse name: Not on file  . Number of children: Not on file  . Years of education: Not on file  . Highest education level: Not on file  Occupational History  . Not on file  Social Needs  . Financial resource strain: Not on file  . Food insecurity:    Worry: Not on file    Inability: Not on file  . Transportation needs:    Medical: Not on file    Non-medical: Not on file  Tobacco Use  . Smoking status: Never Smoker  . Smokeless tobacco: Never Used  Substance and Sexual Activity  . Alcohol use: Yes    Comment: rarely  . Drug use: No  . Sexual activity: Not on file  Lifestyle  . Physical activity:    Days per week: Not on file    Minutes per session: Not on file  . Stress: Not on file  Relationships  . Social connections:    Talks on phone: Not on file    Gets together: Not on file    Attends religious service: Not on file    Active member of club or organization: Not on file    Attends meetings of clubs or organizations: Not on file    Relationship status: Not on file  . Intimate partner violence:    Fear of current or ex partner: Not on file    Emotionally abused: Not on file    Physically abused: Not on file    Forced sexual activity: Not on file  Other Topics Concern  . Not on file  Social History Narrative   Divorced,  has girlfriend, has 2 children (40 and 53), lives with girlfriend. 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 Denies coffee but drinks 1-2 sodas per day, eats a lot of junk food.  02/2016    Family History  Problem Relation Age of Onset  . Cancer Father        prostate, age 57  . Hyperlipidemia Father   . Diabetes Mother   . Hypertension Mother   . Osteoarthritis Mother        Knees  . Cancer Paternal Uncle        prostate  . Cancer Maternal Uncle        thyroid  . Heart disease Neg Hx   . Stroke Neg Hx    Review of Systems  Pertinent negatives listed in HPI  Patient Active Problem List   Diagnosis Date Noted  . OSA (obstructive sleep apnea) 08/23/2017  . Screen for colon cancer 07/06/2017  . Essential hypertension, benign 07/06/2017  . History of anemia 07/06/2017  . Changing skin lesion 07/06/2017  . Snoring 07/06/2017  . Witnessed apneic spells 07/06/2017  . Chronic pain of left knee 01/17/2017  .  Screening for prostate cancer 02/12/2016  . Obesity 02/12/2016  . Family history of prostate cancer in father 02/12/2016  . Encounter for health maintenance examination in adult 02/12/2016  . Genital herpes 02/12/2016  . Insomnia 02/12/2016  . Erectile dysfunction 02/12/2016  . Skin tag 02/12/2016    No Known Allergies  Prior to Admission medications   Medication Sig Start Date End Date Taking? Authorizing Provider  amLODipine (NORVASC) 5 MG tablet Take 1 tablet (5 mg total) by mouth daily. 07/06/17  Yes Tysinger, Camelia Eng, PA-C  cyclobenzaprine (FLEXERIL) 10 MG tablet Take 10 mg by mouth 3 (three) times daily as needed for muscle spasms.   Yes [provider]  HYDROcodone-acetaminophen (NORCO/VICODIN) 5-325 MG tablet Take 2 tablets by mouth every 4 (four) hours as needed. 05/22/18  Yes Wardell Honour, MD  meloxicam (MOBIC) 7.5 MG tablet TAKE 1 TABLET TWICE A DAY AS NEEDED FOR PAIN 04/06/17  Yes Leandrew Koyanagi, MD  naproxen  sodium (ALEVE) 220 MG tablet Take 220 mg by mouth.   Yes [provider]  pravastatin (PRAVACHOL) 20 MG tablet Take 1 tablet (20 mg total) by mouth every evening. 07/08/17 07/08/18 Yes Tysinger, Camelia Eng, PA-C  sildenafil (VIAGRA) 50 MG tablet Take 1 tablet (50 mg total) by mouth daily as needed for erectile dysfunction. 02/12/16  Yes Tysinger, Camelia Eng, PA-C  valACYclovir (VALTREX) 1000 MG tablet 2 tablets po BID x 1 day for flare up 07/06/17  Yes Tysinger, Camelia Eng, PA-C    Past Medical, Surgical Family and Social History reviewed and updated.    Objective:   Today's Vitals   06/17/18 0839  BP: 121/76  Pulse: 93  Resp: 16  Temp: 98 F (36.7 C)  TempSrc: Oral  SpO2: 98%  Weight: 234 lb (106.1 kg)    Wt Readings from Last 3 Encounters:  06/17/18 234 lb (106.1 kg)  08/23/17 233 lb 3.2 oz (105.8 kg)  08/05/17 230 lb (104.3 kg)     Physical Exam General appearance: alert, well developed, well nourished, cooperative and in no distress Head: Normocephalic, without obvious abnormality, atraumatic Respiratory: Respirations even and unlabored, normal respiratory rate Heart: rate and rhythm normal. No gallop or murmurs noted on exam  Extremities: No gross deformities Skin: Skin color, texture, turgor normal. No rashes seen  Psych: Appropriate mood and affect. Neurologic: Mental status: Alert, oriented to person, place, and time, thought content appropriate.  No results found for: POCGLU  Lab Results  Component Value Date   HGBA1C 6.0 (H) 07/07/2017      Assessment & Plan:  1. Acute non-recurrent maxillary sinusitis 2. Cough -Start Augmentin twice daily x 10 days -Start benzonatate as needed for cough   -The patient was given clear instructions to go to ER or return to medical center if symptoms do not improve, worsen or new problems develop. The patient verbalized understanding.     Carroll Sage. Kenton Kingfisher, FNP-C Nurse Practitioner (PRN Staff)  Primary Care at  [facility] [address] [place], [place]

## 2018-07-12 HISTORY — PX: COLONOSCOPY: SHX174

## 2018-08-08 ENCOUNTER — Other Ambulatory Visit: Payer: Self-pay | Admitting: Medical

## 2018-08-08 NOTE — Telephone Encounter (Signed)
Pt has not been seen since February last year. lmom asking patient to call and schedule an appointment or inform us is he has a new pcp.

## 2018-08-17 ENCOUNTER — Telehealth: Payer: Self-pay | Admitting: Medical

## 2018-08-17 NOTE — Telephone Encounter (Signed)
Per Beverlee Nims all calls to medical assistant

## 2018-08-17 NOTE — Telephone Encounter (Signed)
I am a little confused.   His last labs were over 1 year ago.  With BP medication, we have to check labs yearly, and he had been good about coming in for physical yearly.   Not sure how we got so delayed to due physical in 09/2018?  It also appears he may have established at another family practice per chart recent encounter??  My last visit notes recommended f/u 6 months which is past due.    Please get some info on this above.

## 2018-08-17 NOTE — Telephone Encounter (Signed)
Pt called and scheduled a cpe for 03.05.2020. Pt needs refills on Amlodipine and Valtrex until appt. Please send to [place].

## 2018-08-18 ENCOUNTER — Other Ambulatory Visit: Payer: Self-pay | Admitting: Medical

## 2018-08-18 MED ORDER — AMLODIPINE BESYLATE 5 MG PO TABS: 5.0000 mg | ORAL_TABLET | Freq: Every day | ORAL | 0 refills | Status: DC

## 2018-08-18 MED ORDER — VALACYCLOVIR HCL 1 G PO TABS: ORAL_TABLET | ORAL | 0 refills | Status: DC

## 2018-08-18 NOTE — Telephone Encounter (Signed)
Pt states he has not established with another primary care.  He has cpe 3/5 and needs amlodipine and valtrex please to [facility] [address].

## 2018-09-10 ENCOUNTER — Other Ambulatory Visit: Payer: Self-pay | Admitting: Medical

## 2018-09-14 ENCOUNTER — Encounter: Payer: Self-pay | Admitting: Medical

## 2018-09-14 ENCOUNTER — Ambulatory Visit (INDEPENDENT_AMBULATORY_CARE_PROVIDER_SITE_OTHER): Payer: BLUE CROSS/BLUE SHIELD | Admitting: Medical

## 2018-09-14 VITALS — BP 136/96 | HR 86 | Temp 97.9°F | Ht 72.0 in | Wt 234.8 lb

## 2018-09-14 DIAGNOSIS — Z8042 Family history of malignant neoplasm of prostate: Secondary | ICD-10-CM

## 2018-09-14 DIAGNOSIS — G4700 Insomnia, unspecified: Secondary | ICD-10-CM

## 2018-09-14 DIAGNOSIS — L918 Other hypertrophic disorders of the skin: Secondary | ICD-10-CM | POA: Diagnosis not present

## 2018-09-14 DIAGNOSIS — I10 Essential (primary) hypertension: Secondary | ICD-10-CM

## 2018-09-14 DIAGNOSIS — Z125 Encounter for screening for malignant neoplasm of prostate: Secondary | ICD-10-CM

## 2018-09-14 DIAGNOSIS — R0681 Apnea, not elsewhere classified: Secondary | ICD-10-CM

## 2018-09-14 DIAGNOSIS — L910 Hypertrophic scar: Secondary | ICD-10-CM | POA: Insufficient documentation

## 2018-09-14 DIAGNOSIS — E669 Obesity, unspecified: Secondary | ICD-10-CM

## 2018-09-14 DIAGNOSIS — G4733 Obstructive sleep apnea (adult) (pediatric): Secondary | ICD-10-CM | POA: Diagnosis not present

## 2018-09-14 DIAGNOSIS — A6000 Herpesviral infection of urogenital system, unspecified: Secondary | ICD-10-CM

## 2018-09-14 DIAGNOSIS — R7301 Impaired fasting glucose: Secondary | ICD-10-CM | POA: Insufficient documentation

## 2018-09-14 DIAGNOSIS — Z1211 Encounter for screening for malignant neoplasm of colon: Secondary | ICD-10-CM

## 2018-09-14 DIAGNOSIS — E8881 Metabolic syndrome: Secondary | ICD-10-CM | POA: Insufficient documentation

## 2018-09-14 DIAGNOSIS — Z0000 Encounter for general adult medical examination without abnormal findings: Secondary | ICD-10-CM

## 2018-09-14 DIAGNOSIS — R0683 Snoring: Secondary | ICD-10-CM

## 2018-09-14 DIAGNOSIS — N529 Male erectile dysfunction, unspecified: Secondary | ICD-10-CM

## 2018-09-14 DIAGNOSIS — E88819 Insulin resistance, unspecified: Secondary | ICD-10-CM

## 2018-09-14 LAB — CBC
Hematocrit: 44.5 % (ref 37.5–51.0)
Hemoglobin: 13.6 g/dL (ref 13.0–17.7)
MCH: 23 pg — ABNORMAL LOW (ref 26.6–33.0)
MCHC: 30.6 g/dL — ABNORMAL LOW (ref 31.5–35.7)
MCV: 75 fL — ABNORMAL LOW (ref 79–97)
Platelets: 397 10*3/uL (ref 150–450)
RBC: 5.91 x10E6/uL — ABNORMAL HIGH (ref 4.14–5.80)
RDW: 14.3 % (ref 11.6–15.4)
WBC: 6.6 10*3/uL (ref 3.4–10.8)

## 2018-09-14 LAB — COMPREHENSIVE METABOLIC PANEL
ALT: 20 IU/L (ref 0–44)
AST: 17 IU/L (ref 0–40)
Albumin/Globulin Ratio: 1.6 (ref 1.2–2.2)
Albumin: 4.5 g/dL (ref 4.0–5.0)
Alkaline Phosphatase: 72 IU/L (ref 39–117)
BUN/Creatinine Ratio: 11 (ref 9–20)
BUN: 12 mg/dL (ref 6–24)
Bilirubin Total: 0.4 mg/dL (ref 0.0–1.2)
CO2: 23 mmol/L (ref 20–29)
Calcium: 9.6 mg/dL (ref 8.7–10.2)
Chloride: 102 mmol/L (ref 96–106)
Creatinine, Ser: 1.1 mg/dL (ref 0.76–1.27)
GFR calc Af Amer: 91 mL/min/{1.73_m2} (ref >59)
GFR calc non Af Amer: 78 mL/min/{1.73_m2} (ref >59)
Globulin, Total: 2.8 g/dL (ref 1.5–4.5)
Glucose: 81 mg/dL (ref 65–99)
Potassium: 4.5 mmol/L (ref 3.5–5.2)
Sodium: 141 mmol/L (ref 134–144)
Total Protein: 7.3 g/dL (ref 6.0–8.5)

## 2018-09-14 LAB — HEMOGLOBIN A1C
Est. average glucose Bld gHb Est-mCnc: 128 mg/dL
Hgb A1c MFr Bld: 6.1 % — ABNORMAL HIGH (ref 4.8–5.6)

## 2018-09-14 LAB — POCT URINALYSIS DIP (PROADVANTAGE DEVICE)
Bilirubin, UA: NEGATIVE
Blood, UA: NEGATIVE
Glucose, UA: NEGATIVE mg/dL
Ketones, POC UA: NEGATIVE mg/dL
Leukocytes, UA: NEGATIVE
Nitrite, UA: NEGATIVE
Protein Ur, POC: NEGATIVE mg/dL
Specific Gravity, Urine: 1.025
Urobilinogen, Ur: NEGATIVE
pH, UA: 6 (ref 5.0–8.0)

## 2018-09-14 LAB — LIPID PANEL
Chol/HDL Ratio: 4.5 ratio (ref 0.0–5.0)
Cholesterol, Total: 201 mg/dL — ABNORMAL HIGH (ref 100–199)
HDL: 45 mg/dL (ref >39)
LDL Calculated: 135 mg/dL — ABNORMAL HIGH (ref 0–99)
Triglycerides: 107 mg/dL (ref 0–149)
VLDL Cholesterol Cal: 21 mg/dL (ref 5–40)

## 2018-09-14 NOTE — Patient Instructions (Signed)
Thanks for trusting [place] with your health care and for coming in for a physical today.  Below are some general recommendations I have for you:  Yearly screenings See your eye doctor yearly for routine vision care. See your dentist yearly for routine dental care including hygiene visits twice yearly. See me here yearly for a routine physical and preventative care visit   Specific Concerns today:  . You have uncontrolled sleep apnea . Consider CPAP use . Please work to lose weight as a means to treat the sleep apnea . Consider [employer], ketogenic diet, weight watchers or other specific program . Try to exercise minimum of 150 minutes/week . Increase water intake, decrease use of soda or sweet tea or other sugary drinks   Please follow up yearly for a physical.   Preventative Care for Adults - Male      [place]:  A routine yearly physical is a good way to check in with your primary care provider about your health and preventive screening. It is also an opportunity to share updates about your health and any concerns you have, and receive a thorough all-over exam.   Most health insurance companies pay for at least some preventative services.  Check with your health plan for specific coverages.  WHAT PREVENTATIVE SERVICES DO MEN NEED?  Adult men should have their weight and blood pressure checked regularly.   Men age 45 and older should have their cholesterol levels checked regularly.  Beginning at age 62 and continuing to age 98, men should be screened for colorectal cancer.  Certain people may need continued testing until age 52.  Updating vaccinations is part of preventative care.  Vaccinations help protect against diseases such as the flu.  Osteoporosis is a disease in which the bones lose minerals and strength as we age. Men ages 75 and over should discuss this with their caregivers  Lab tests are generally done as part of preventative care to  screen for anemia and blood disorders, to screen for problems with the kidneys and liver, to screen for bladder problems, to check blood sugar, and to check your cholesterol level.  Preventative services generally include counseling about diet, exercise, avoiding tobacco, drugs, excessive alcohol consumption, and sexually transmitted infections.    GENERAL RECOMMENDATIONS FOR GOOD HEALTH:  Healthy diet:  Eat a variety of foods, including fruit, vegetables, animal or vegetable protein, such as meat, fish, chicken, and eggs, or beans, lentils, tofu, and grains, such as rice.  Drink plenty of water daily.  Decrease saturated fat in the diet, avoid lots of red meat, processed foods, sweets, fast foods, and fried foods.  Exercise:  Aerobic exercise helps maintain good heart health. At least 30-40 minutes of moderate-intensity exercise is recommended. For example, a brisk walk that increases your heart rate and breathing. This should be done on most days of the week.   Find a type of exercise or a variety of exercises that you enjoy so that it becomes a part of your daily life.  Examples are running, walking, swimming, water aerobics, and biking.  For motivation and support, explore group exercise such as aerobic class, spin class, Zumba, Yoga,or  martial arts, etc.    Set exercise goals for yourself, such as a certain weight goal, walk or run in a race such as a 5k walk/run.  Speak to your primary care provider about exercise goals.  Disease prevention:  If you smoke or chew tobacco, find out from your  caregiver how to quit. It can literally save your life, no matter how long you have been a tobacco user. If you do not use tobacco, never begin.   Maintain a healthy diet and normal weight. Increased weight leads to problems with blood pressure and diabetes.   The Body Mass Index or BMI is a way of measuring how much of your body is fat. Having a BMI above 27 increases the risk of heart disease,  diabetes, hypertension, stroke and other problems related to obesity. Your caregiver can help determine your BMI and based on it develop an exercise and dietary program to help you achieve or maintain this important measurement at a healthful level.  High blood pressure causes heart and blood vessel problems.  Persistent high blood pressure should be treated with medicine if weight loss and exercise do not work.   Fat and cholesterol leaves deposits in your arteries that can block them. This causes heart disease and vessel disease elsewhere in your body.  If your cholesterol is found to be high, or if you have heart disease or certain other medical conditions, then you may need to have your cholesterol monitored frequently and be treated with medication.   Ask if you should have a cardiac stress test if your history suggests this. A stress test is a test done on a treadmill that looks for heart disease. This test can find disease prior to there being a problem.  Osteoporosis is a disease in which the bones lose minerals and strength as we age. This can result in serious bone fractures. Risk of osteoporosis can be identified using a bone density scan. Men ages 65 and over should discuss this with their caregivers. Ask your caregiver whether you should be taking a calcium supplement and Vitamin D, to reduce the rate of osteoporosis.   Avoid drinking alcohol in excess (more than two drinks per day).  Avoid use of street drugs. Do not share needles with anyone. Ask for professional help if you need assistance or instructions on stopping the use of alcohol, cigarettes, and/or drugs.  Brush your teeth twice a day with fluoride toothpaste, and floss once a day. Good oral hygiene prevents tooth decay and gum disease. The problems can be painful, unattractive, and can cause other health problems. Visit your dentist for a routine oral and dental check up and preventive care every 6-12 months.   Look at your skin  regularly.  Use a mirror to look at your back. Notify your caregivers of changes in moles, especially if there are changes in shapes, colors, a size larger than a pencil eraser, an irregular border, or development of new moles.  Safety:  Use seatbelts 100% of the time, whether driving or as a passenger.  Use safety devices such as hearing protection if you work in environments with loud noise or significant background noise.  Use safety glasses when doing any work that could send debris in to the eyes.  Use a helmet if you ride a bike or motorcycle.  Use appropriate safety gear for contact sports.  Talk to your caregiver about gun safety.  Use sunscreen with a SPF (or skin protection factor) of 15 or greater.  Lighter skinned people are at a greater risk of skin cancer. Don't forget to also wear sunglasses in order to protect your eyes from too much damaging sunlight. Damaging sunlight can accelerate cataract formation.   Practice safe sex. Use condoms. Condoms are used for birth control and to help  reduce the spread of sexually transmitted infections (or STIs).  Some of the STIs are gonorrhea (the clap), chlamydia, syphilis, trichomonas, herpes, HPV (human papilloma virus) and HIV (human immunodeficiency virus) which causes AIDS. The herpes, HIV and HPV are viral illnesses that have no cure. These can result in disability, cancer and death.   Keep carbon monoxide and smoke detectors in your home functioning at all times. Change the batteries every 6 months or use a model that plugs into the wall.   Vaccinations:  Stay up to date with your tetanus shots and other required immunizations. You should have a booster for tetanus every 10 years. Be sure to get your flu shot every year, since 5%-20% of the [place] population comes down with the flu. The flu vaccine changes each year, so being vaccinated once is not enough. Get your shot in the fall, before the flu season peaks.   Other vaccines to  consider:  Human Papilloma Virus or HPV causes cancer of the cervix, and other infections that can be transmitted from person to person. There is a vaccine for HPV, and males should get immunized between the ages of 96 and 50. It requires a series of 3 shots.   Pneumococcal vaccine to protect against certain types of pneumonia.  This is normally recommended for adults age 30 or older.  However, adults younger than 50 years old with certain underlying conditions such as diabetes, heart or lung disease should also receive the vaccine.  Shingles vaccine to protect against Varicella Zoster if you are older than age 19, or younger than 50 years old with certain underlying illness.  If you have not had the Shingrix vaccine, please call your insurer to inquire about coverage for the Shingrix vaccine given in 2 doses.   Some insurers cover this vaccine after age 26, some cover this after age 7.  If your insurer covers this, then call to schedule appointment to have this vaccine here  Hepatitis A vaccine to protect against a form of infection of the liver by a virus acquired from food.  Hepatitis B vaccine to protect against a form of infection of the liver by a virus acquired from blood or body fluids, particularly if you work in health care.  If you plan to travel internationally, check with your local health department for specific vaccination recommendations.   What should I know about cancer screening? Many types of cancers can be detected early and may often be prevented. Lung Cancer  You should be screened every year for lung cancer if: ? You are a current smoker who has smoked for at least 30 years. ? You are a former smoker who has quit within the past 15 years.  Talk to your health care provider about your screening options, when you should start screening, and how often you should be screened.  Colorectal Cancer  Routine colorectal cancer screening usually begins at 50 years of age and  should be repeated every 5-10 years until you are 50 years old. You may need to be screened more often if early forms of precancerous polyps or small growths are found. Your health care provider may recommend screening at an earlier age if you have risk factors for colon cancer.  Your health care provider may recommend using home test kits to check for hidden blood in the stool.  A small camera at the end of a tube can be used to examine your colon (sigmoidoscopy or colonoscopy). This checks for the earliest  forms of colorectal cancer.  Prostate and Testicular Cancer  Depending on your age and overall health, your health care provider may do certain tests to screen for prostate and testicular cancer.  Talk to your health care provider about any symptoms or concerns you have about testicular or prostate cancer.  Skin Cancer  Check your skin from head to toe regularly.  Tell your health care provider about any new moles or changes in moles, especially if: ? There is a change in a mole's size, shape, or color. ? You have a mole that is larger than a pencil eraser.  Always use sunscreen. Apply sunscreen liberally and repeat throughout the day.  Protect yourself by wearing long sleeves, pants, a wide-brimmed hat, and sunglasses when outside.      Sleep Apnea Sleep apnea affects breathing during sleep. It causes breathing to stop for a short time or to become shallow. It can also increase the risk of:  Heart attack.  Stroke.  Being very overweight (obese).  Diabetes.  Heart failure.  Irregular heartbeat. The goal of treatment is to help you breathe normally again. What are the causes? There are three kinds of sleep apnea:  Obstructive sleep apnea. This is caused by a blocked or collapsed airway.  Central sleep apnea. This happens when the brain does not send the right signals to the muscles that control breathing.  Mixed sleep apnea. This is a combination of obstructive  and central sleep apnea. The most common cause of this condition is a collapsed or blocked airway. This can happen if:  Your throat muscles are too relaxed.  Your tongue and tonsils are too large.  You are overweight.  Your airway is too small. What increases the risk?  Being overweight.  Smoking.  Having a small airway.  Being older.  Being male.  Drinking alcohol.  Taking medicines to calm yourself (sedatives or tranquilizers).  Having family members with the condition. What are the signs or symptoms?  Trouble staying asleep.  Being sleepy or tired during the day.  Getting angry a lot.  Loud snoring.  Headaches in the morning.  Not being able to focus your mind (concentrate).  Forgetting things.  Less interest in sex.  Mood swings.  Personality changes.  Feelings of sadness (depression).  Waking up a lot during the night to pee (urinate).  Dry mouth.  Sore throat. How is this diagnosed?  Your medical history.  A physical exam.  A test that is done when you are sleeping (sleep study). The test is most often done in a sleep lab but may also be done at home. How is this treated?   Sleeping on your side.  Using a medicine to get rid of mucus in your nose (decongestant).  Avoiding the use of alcohol, medicines to help you relax, or certain pain medicines (narcotics).  Losing weight, if needed.  Changing your diet.  Not smoking.  Using a machine to open your airway while you sleep, such as: ? An oral appliance. This is a mouthpiece that shifts your lower jaw forward. ? A CPAP device. This device blows air through a mask when you breathe out (exhale). ? An EPAP device. This has valves that you put in each nostril. ? A BPAP device. This device blows air through a mask when you breathe in (inhale) and breathe out.  Having surgery if other treatments do not work. It is important to get treatment for sleep apnea. Without treatment, it can  lead  to:  High blood pressure.  Coronary artery disease.  In men, not being able to have an erection (impotence).  Reduced thinking ability. Follow these instructions at home: Lifestyle  Make changes that your doctor recommends.  Eat a healthy diet.  Lose weight if needed.  Avoid alcohol, medicines to help you relax, and some pain medicines.  Do not use any products that contain nicotine or tobacco, such as cigarettes, e-cigarettes, and chewing tobacco. If you need help quitting, ask your doctor. General instructions  Take over-the-counter and prescription medicines only as told by your doctor.  If you were given a machine to use while you sleep, use it only as told by your doctor.  If you are having surgery, make sure to tell your doctor you have sleep apnea. You may need to bring your device with you.  Keep all follow-up visits as told by your doctor. This is important. Contact a doctor if:  The machine that you were given to use during sleep bothers you or does not seem to be working.  You do not get better.  You get worse. Get help right away if:  Your chest hurts.  You have trouble breathing in enough air.  You have an uncomfortable feeling in your back, arms, or stomach.  You have trouble talking.  One side of your body feels weak.  A part of your face is hanging down. These symptoms may be an emergency. Do not wait to see if the symptoms will go away. Get medical help right away. Call your local emergency services (911 in the [place] Do not drive yourself to the hospital. Summary  This condition affects breathing during sleep.  The most common cause is a collapsed or blocked airway.  The goal of treatment is to help you breathe normally while you sleep. This information is not intended to replace advice given to you by your health care provider. Make sure you discuss any questions you have with your health care provider. Document Released: 04/06/2008  Document Revised: 02/21/2018 Document Reviewed: 02/21/2018 Elsevier Interactive Patient Education  2019 [place].        Metabolic Syndrome Metabolic syndrome occurs when you have a combination of three or more factors that increase your chances of developing cardiovascular disease and diabetes. These factors include:  High fasting blood sugar (glucose).  High blood triglyceride level.  High blood pressure.  Low levels of high-density lipoprotein (HDL) blood cholesterol.  Having a waist measurement that is: ? More than 40 inches in men. ? More than 35 inches in women. Metabolic syndrome is sometimes called insulin resistance syndrome or syndrome X. What are the causes? The exact cause of this condition is not known. It may be related to a combination of the factors that were passed down from your parents (genes) and things that you do, eat, and drink (lifestyle choices). What increases the risk? You are more likely to develop this condition if you:  Eat a diet high in calories and saturated fat.  Do not exercise regularly.  Are obese.  Have a family history of type 2 diabetes mellitus.  Have insulin resistance.  Have a history of gestational diabetes during a previous pregnancy.  Have conditions such as cardiovascular disease, nonalcoholic fatty liver disease, or polycystic ovary syndrome (PCOS).  Are older. The risk increases with age.  Use any tobacco products, including cigarettes, chewing tobacco, or e-cigarettes. What are the signs or symptoms? Metabolic syndrome has no specific symptoms. Having abnormal blood test  results may be the only signs of metabolic syndrome. How is this diagnosed? This condition may be diagnosed based on:  Your blood pressure measurements.  Your waist measurement.  Blood tests.  Your personal and family medical history. How is this treated? Treatment may include:  Lifestyle changes to reduce your risk for heart disease,  stroke, and diabetes, such as: ? Exercise. ? Weight loss. ? Eating a healthy diet. ? Stopping tobacco and nicotine use.  Medicines that: ? Help your body maintain normal blood glucose levels. ? Lower your blood pressure and your blood triglyceride levels. Follow these instructions at home:      Take over-the-counter and prescription medicines only as told by your health care provider.  Exercise regularly, as told by your health care provider.  Eat a healthy diet that includes fresh fruits and vegetables, whole grains, lean proteins, and low-fat or nonfat dairy products.  Maintain a healthy weight. Work with your health care provider to lose weight safely, if needed.  Do not use any products that contain nicotine or tobacco, such as cigarettes and e-cigarettes. If you need help quitting, ask your health care provider.  If directed, measure your waist regularly and write down the measurements. To measure your waist: ? Stand up straight. ? Breathe out. ? Wrap a measuring tape around the part of your waist that is just above your hip bones. ? Read and write down the measurement.  Keep all follow-up visits as told by your health care provider. This is important. Contact a health care provider if:  You feel very tired.  You are extremely thirsty.  You urinate a lot more than usual.  Your waist gets bigger.  You have headaches that do not go away. Get help right away if:  You suddenly develop any of the following: ? Dizziness. ? Blurry vision. ? Trouble speaking. ? Trouble swallowing. ? Weakness in an arm or leg. ? Chest pain. ? Trouble breathing.  Your heartbeat feels abnormal.  You faint. Summary  Metabolic syndrome occurs when you have a combination of three or more factors that increase your chances of developing cardiovascular disease and diabetes.  These factors include a high fasting blood sugar (glucose), high blood triglyceride level, high blood pressure,  low levels of high-density lipoprotein (HDL) blood cholesterol, and a waist measurement that is more than 40 inches in men or more than 35 inches in women.  Metabolic syndrome has no specific symptoms. Having abnormal blood test results may be the only signs of metabolic syndrome.  Treatment may include lifestyle changes and medicine to reduce your risk for heart disease, stroke, and diabetes. This information is not intended to replace advice given to you by your health care provider. Make sure you discuss any questions you have with your health care provider. Document Released: 10/05/2007 Document Revised: 07/11/2017 Document Reviewed: 07/11/2017 Elsevier Interactive Patient Education  2019 [employer].

## 2018-09-14 NOTE — Progress Notes (Signed)
Subjective:   HPI  Charles Rojas. is a 48 y.o. male who presents for Chief Complaint  Patient presents with  . Annual Exam    Medical care team includes: Rutger Salton, Camelia Eng, PA-C here for primary care Dentist Eye doctor  Concerns: Doing well  Hypertension- compliant with amlodipine 5 mg daily  Erectile dysfunction-using Viagra without complaint  Uses Valtrex as needed  OSA - based on sleep study this past year, but never decided to use CPAP.   Snoring.  Sleeps 4-5 hours per night.     Reviewed their medical, surgical, family, social, medication, and allergy history and updated chart as appropriate.  Past Medical History:  Diagnosis Date  . Erectile dysfunction   . Family history of prostate cancer   . H/O oral surgery   . Herpes    genital  . Hypertension   . Insomnia   . Obesity   . OSA (obstructive sleep apnea) 07/2017  . Renal stone 2019    Past Surgical History:  Procedure Laterality Date  . APPENDECTOMY    . MOUTH SURGERY      Social History   Socioeconomic History  . Marital status: Legally Separated    Spouse name: Not on file  . Number of children: Not on file  . Years of education: Not on file  . Highest education level: Not on file  Occupational History  . Not on file  Social Needs  . Financial resource strain: Not on file  . Food insecurity:    Worry: Not on file    Inability: Not on file  . Transportation needs:    Medical: Not on file    Non-medical: Not on file  Tobacco Use  . Smoking status: Never Smoker  . Smokeless tobacco: Never Used  Substance and Sexual Activity  . Alcohol use: Yes    Comment: rarely  . Drug use: No  . Sexual activity: Not on file  Lifestyle  . Physical activity:    Days per week: Not on file    Minutes per session: Not on file  . Stress: Not on file  Relationships  . Social connections:    Talks on phone: Not on file    Gets together: Not on file    Attends religious service: Not on file   Active member of club or organization: Not on file    Attends meetings of clubs or organizations: Not on file    Relationship status: Not on file  . Intimate partner violence:    Fear of current or ex partner: Not on file    Emotionally abused: Not on file    Physically abused: Not on file    Forced sexual activity: Not on file  Other Topics Concern  . Not on file  Social History Narrative   Remarried 11/2017.   Divorced, has girlfriend, has 2 children, lives with girlfriend. Is a Dealer, works on school buses and trucks.  Exercise - cycling.   09/2018.    Family History  Problem Relation Age of Onset  . Cancer Father        prostate, age 65  . Hyperlipidemia Father   . Diabetes Mother   . Hypertension Mother   . Osteoarthritis Mother        Knees  . Hyperlipidemia Sister   . Hypertension Sister   . Cancer Paternal Uncle        prostate  . Cancer Maternal Uncle        thyroid  .  Heart disease Neg Hx   . Stroke Neg Hx      Current Outpatient Medications:  .  amLODipine (NORVASC) 5 MG tablet, TAKE 1 TABLET BY MOUTH EVERY DAY, Disp: 30 tablet, Rfl: 0 .  sildenafil (VIAGRA) 50 MG tablet, Take 1 tablet (50 mg total) by mouth daily as needed for erectile dysfunction., Disp: 10 tablet, Rfl: 5 .  valACYclovir (VALTREX) 1000 MG tablet, 2 tablets po BID x 1 day for flare up, Disp: 20 tablet, Rfl: 0  No Known Allergies     Review of Systems Constitutional: -fever, -chills, -sweats, -unexpected weight change, -decreased appetite, -fatigue Allergy: -sneezing, -itching, -congestion Dermatology: -changing moles, --rash, -lumps ENT: -runny nose, -ear pain, -sore throat, -hoarseness, -sinus pain, -teeth pain, - ringing in ears, -hearing loss, -nosebleeds Cardiology: -chest pain, -palpitations, -swelling, -difficulty breathing when lying flat, -waking up short of breath Respiratory: -cough, -shortness of breath, -difficulty breathing with exercise or exertion, -wheezing, -coughing up  blood Gastroenterology: -abdominal pain, -nausea, -vomiting, -diarrhea, -constipation, -blood in stool, -changes in bowel movement, -difficulty swallowing or eating Hematology: -bleeding, -bruising  Musculoskeletal: -joint aches, -muscle aches, -joint swelling, -back pain, -neck pain, -cramping, -changes in gait Ophthalmology: denies vision changes, eye redness, itching, discharge Urology: -burning with urination, -difficulty urinating, -blood in urine, -urinary frequency, -urgency, -incontinence Neurology: -headache, -weakness, -tingling, -numbness, -memory loss, -falls, -dizziness Psychology: -depressed mood, -agitation, -sleep problems Male GU: no testicular mass, pain, no lymph nodes swollen, no swelling, no rash.     Objective:  BP (!) 136/96   Pulse 86   Temp 97.9 F (36.6 C) (Oral)   Ht 6' (1.829 m)   Wt 234 lb 12.8 oz (106.5 kg)   SpO2 95%   BMI 31.84 kg/m   General appearance: alert, no distress, WD/WN, African American male Skin: Umbilicus with 2 thickened and raised somewhat pedunculated cysts appearing keloidal tissue from prior laparoscopic surgery scar, numerous skin tags, pedunculated circumferential around neck, no other worrisome lesions HEENT: normocephalic, conjunctiva/corneas normal, sclerae anicteric, PERRLA, EOMi, nares patent, no discharge or erythema, pharynx normal Oral cavity: MMM, tongue normal, teeth in good repair Neck: supple, no lymphadenopathy, no thyromegaly, no masses, normal ROM, no bruits Chest: non tender, normal shape and expansion Heart: RRR, normal S1, S2, no murmurs Lungs: CTA bilaterally, no wheezes, rhonchi, or rales Abdomen: +bs, soft, non tender, non distended, no masses, no hepatomegaly, no splenomegaly, no bruits Back: non tender, normal ROM, no scoliosis Musculoskeletal: upper extremities non tender, no obvious deformity, normal ROM throughout, lower extremities non tender, no obvious deformity, normal ROM throughout Extremities: no  edema, no cyanosis, no clubbing Pulses: 2+ symmetric, upper and lower extremities, normal cap refill Neurological: alert, oriented x 3, CN2-12 intact, strength normal upper extremities and lower extremities, sensation normal throughout, DTRs 2+ throughout, no cerebellar signs, gait normal Psychiatric: normal affect, behavior normal, pleasant  GU: normal male external genitalia,circumcised, nontender, no masses, no hernia, no lymphadenopathy Rectal: deferred  EKG  physical, high blood pressure indications, rate 66 bpm, PR 120 ms QRS 98 ms, QTC 387 ms, axis 34 degrees, normal sinus rhythm    Assessment and Plan :   Encounter Diagnoses  Name Primary?  . Encounter for health maintenance examination in adult Yes  . Essential hypertension, benign   . OSA (obstructive sleep apnea)   . Skin tag   . Genital herpes simplex, unspecified site   . Screen for colon cancer   . Snoring   . Witnessed apneic spells   . Screening  for prostate cancer   . Obesity with serious comorbidity, unspecified classification, unspecified obesity type   . Family history of prostate cancer in father   . Erectile dysfunction, unspecified erectile dysfunction type   . Insomnia, unspecified type   . Insulin resistance   . Impaired fasting blood sugar   . Keloid     Physical exam - discussed and counseled on healthy lifestyle, diet, exercise, preventative care, vaccinations, sick and well care, proper use of emergency dept and after hours care, and addressed their concerns.    Health screening: See your eye doctor yearly for routine vision care. See your dentist yearly for routine dental care including hygiene visits twice yearly.  Cancer screening Advised monthly self testicular exam  Colonoscopy:  In fall after he turns 48yo  Discussed PSA, prostate exam, and prostate cancer screening risks/benefits.     Vaccinations: Advised yearly influenza vaccine He is up to date on Td vaccine  Acute issues  discussed: none  Separate significant chronic issues discussed: Hypertension-continue current medication, EKG reviewed, labs today  OSA-strongly advised CPAP but he declines.  He is going to work towards weight loss as a means of treatment along with raising head of bed  Genital herpes-Valtrex as needed  Obesity- he will work on weight loss changes with healthy diet and exercise  Renal stone this past year-resolved  Keloid of skin-advised to consult with plastic surgery   Deundra was seen today for annual exam.  Diagnoses and all orders for this visit:  Encounter for health maintenance examination in adult -     POCT Urinalysis DIP (Proadvantage Device) -     Comprehensive metabolic panel -     CBC -     Lipid panel -     Hemoglobin A1c -     EKG 12-Lead  Essential hypertension, benign -     EKG 12-Lead  OSA (obstructive sleep apnea) -     EKG 12-Lead  Skin tag  Genital herpes simplex, unspecified site  Screen for colon cancer  Snoring  Witnessed apneic spells  Screening for prostate cancer  Obesity with serious comorbidity, unspecified classification, unspecified obesity type  Family history of prostate cancer in father  Erectile dysfunction, unspecified erectile dysfunction type  Insomnia, unspecified type  Insulin resistance -     Hemoglobin A1c  Impaired fasting blood sugar -     Hemoglobin A1c  Keloid    Follow-up pending labs, yearly for physical

## 2018-09-15 ENCOUNTER — Other Ambulatory Visit: Payer: Self-pay | Admitting: Medical

## 2018-09-15 ENCOUNTER — Encounter: Payer: Self-pay | Admitting: Medical

## 2018-09-15 MED ORDER — AMLODIPINE BESYLATE 10 MG PO TABS: 10.0000 mg | ORAL_TABLET | Freq: Every day | ORAL | 3 refills | Status: DC

## 2018-09-15 MED ORDER — SILDENAFIL CITRATE 50 MG PO TABS: 50.0000 mg | ORAL_TABLET | Freq: Every day | ORAL | 11 refills | Status: DC | PRN

## 2018-09-15 MED ORDER — VALACYCLOVIR HCL 1 G PO TABS: ORAL_TABLET | ORAL | 3 refills | Status: DC

## 2018-10-06 ENCOUNTER — Other Ambulatory Visit: Payer: Self-pay | Admitting: Medical

## 2018-12-19 ENCOUNTER — Telehealth: Payer: Self-pay | Admitting: Medical

## 2018-12-19 NOTE — Telephone Encounter (Signed)
Pt was advised K[facility] 

## 2018-12-19 NOTE — Telephone Encounter (Signed)
  Pt called c/o Fever  ( 100.3 100.6) Chills No enegry Achy BP 138/88 Symptoms started Sunday  No travel or known exposures  [facility] and [facility] He should get results in 2-4 days  He would like someone to call him and tell what he can do or needs to do

## 2018-12-19 NOTE — Telephone Encounter (Signed)
Recommend that he alternate to Tylenol 4 times per day with 3 Advil 4 times per day.  Plenty of fluids.  Call if symptoms continue

## 2018-12-21 ENCOUNTER — Ambulatory Visit (INDEPENDENT_AMBULATORY_CARE_PROVIDER_SITE_OTHER): Payer: BC Managed Care – PPO | Admitting: Medical

## 2018-12-21 ENCOUNTER — Other Ambulatory Visit: Payer: Self-pay

## 2018-12-21 ENCOUNTER — Encounter: Payer: Self-pay | Admitting: Medical

## 2018-12-21 ENCOUNTER — Telehealth: Payer: Self-pay | Admitting: Family Medicine

## 2018-12-21 VITALS — Temp 102.7°F | Wt 230.0 lb

## 2018-12-21 DIAGNOSIS — R52 Pain, unspecified: Secondary | ICD-10-CM | POA: Insufficient documentation

## 2018-12-21 DIAGNOSIS — R509 Fever, unspecified: Secondary | ICD-10-CM | POA: Insufficient documentation

## 2018-12-21 NOTE — Telephone Encounter (Signed)
I called and spoke to him earlier.

## 2018-12-21 NOTE — Progress Notes (Signed)
This visit type was conducted due to national recommendations for restrictions regarding the COVID-19 Pandemic (e.g. social distancing) in an effort to limit this patient's exposure and mitigate transmission in our community.  Due to their co-morbid illnesses, this patient is at least at moderate risk for complications without adequate follow up.  This format is felt to be most appropriate for this patient at this time.    Documentation for virtual audio and video telecommunications through Zoom encounter:  The patient was located at home. The provider was located in the office. The patient did consent to this visit and is aware of possible charges through their insurance for this visit.  The other persons participating in this telemedicine service were none. Time spent on call was 15 minutes and in review of previous records >15 minutes total.  This virtual service is not related to other E/M service within previous 7 days.   Subjective: Chief Complaint  Patient presents with  . other    possible COVID fever , chills, sweating, and aches. Started sunday night   Virtual consult for fever.  He reports starting to get symptoms Monday, 4 nights ago with onset of fever, headache, chills, body aches, but no other symptoms.  The symptoms have persisted daily.  The fever has ranged from 101-103.2.  He denies cough, congestion, nausea, vomiting, diarrhea, no belly pain, no back pain, no urinary symptoms, no bowel symptoms, no runny nose or sneezing, no sore throat.  No rash.  No sick contacts.  No coronavirus contact.  No recent travel.  His wife works at the [employer] but reportedly has not had any COVID contact.  He works at [employer] with no known contact there either.  He has been wearing a mask and gloves at work.  No history of prostate infection no history of UTI.  No history of diverticulitis.  No recent insect bites.  He denies wheezing or shortness of breath.  No chest discomfort.  He does feel  exhausted and drained.  He does report that he is hydrating.  He is alternating Tylenol and Advil for pain and fever  No other aggravating or relieving factors. No other complaint.   Past Medical History:  Diagnosis Date  . Erectile dysfunction   . Family history of prostate cancer   . H/O oral surgery   . Herpes    genital  . Hypertension   . Insomnia   . Obesity   . OSA (obstructive sleep apnea) 07/2017  . Renal stone 2019   Current Outpatient Medications on File Prior to Visit  Medication Sig Dispense Refill  . amLODipine (NORVASC) 10 MG tablet Take 1 tablet (10 mg total) by mouth daily. 90 tablet 3  . sildenafil (VIAGRA) 50 MG tablet Take 1 tablet (50 mg total) by mouth daily as needed for erectile dysfunction. 10 tablet 11  . valACYclovir (VALTREX) 1000 MG tablet 2 tablets po BID x 1 day for flare up 20 tablet 3   No current facility-administered medications on file prior to visit.      Objective: Temp (!) 102.7 F (39.3 C)   Wt 230 lb (104.3 kg)   BMI 31.19 kg/m   Gen: nad, otherwise not examined in person Talks in complete sentences    Assessment: Encounter Diagnoses  Name Primary?  . Fever, unspecified fever cause Yes  . Body aches      Plan: We discussed his symptoms and concerns.  We discussed limitations of virtual visit.  His symptoms sound  flu like or likely COVID-19 given the spread in the community.  He reportedly [facility] a [facility] but we discussed that this does not necessarily mean he is negative.  This could be a false negative.  General recommendations: I recommend you rest, hydrate well with water and clear fluids throughout the day.  You can use Tylenol for pain or fever, over the counter Delsym for cough, and over the counter Emetrol for nausea.   If you are having trouble breathing, very weak, high fevers 103 or higher consistently despite Tylenol, or uncontrollable nausea and vomiting, then call or go to the  emergency department.  Symptoms can last potentially 2 weeks or longer based on what we have seen in the community.    Self Quarantine: In general I recommend you avoid contact with people as much as possible for the foreseeable next 2 weeks.   Particularly in your house, isolate your self from others in a separate room, wear a mask when possible in the room, particularly if coughing a lot.   Have others bring food, water, medications, etc., to your door but avoid direct contact with your household contacts during this time to avoid spreading the infection to them.   If you have a separate bathroom and living quarters during the next 2 weeks away from others, that would be preferable.    If you can't completely isolate, then wear a mask, wash hands frequently with soap and water for at least 15 seconds, minimize close contact with others, and have a friend or family member check regularly from a distance to make sure you are not getting seriously worse.     You should not be going out in public, should not be going to stores, to work or other public places until all your symptoms have resolved and at least 7 days + 72 hours of no symptoms at all have transpired.   Ideally you should avoid contact with others for a full 14 days if possible.  One of the goals is to limit spread to high risk people; people that are older and elderly, people with multiple health issues like diabetes, heart disease, lung disease, and anybody that has weakened immune systems such as people with cancer or on immunosuppressive therapy.  Please call back if you have questions and concerns.   He voices agreement and understanding of the plan.  We will send a work note on his behalf.  Henderson was seen today for other.  Diagnoses and all orders for this visit:  Fever, unspecified fever cause  Body aches

## 2018-12-21 NOTE — Telephone Encounter (Signed)
Pt called back and advised [facility]. Please advise pt what to do [phone]

## 2018-12-21 NOTE — Telephone Encounter (Signed)
Pt called, he has not received [facility] Covid [facility] yet [facility]. Pt is still having constant fever of 102.1, chills, sweating, headache, body aches, no energy.  His breathing is fine.  Please call pt [phone]

## 2018-12-22 ENCOUNTER — Encounter: Payer: Self-pay | Admitting: Medical

## 2018-12-22 NOTE — Progress Notes (Signed)
What would u like for me to do with this no instructions

## 2019-01-19 ENCOUNTER — Other Ambulatory Visit: Payer: Self-pay

## 2019-01-19 ENCOUNTER — Encounter (HOSPITAL_COMMUNITY): Payer: Self-pay

## 2019-01-19 ENCOUNTER — Emergency Department (HOSPITAL_COMMUNITY)
Admission: EM | Admit: 2019-01-19 | Discharge: 2019-01-20 | Disposition: A | Discharge status: Home / Self Care | Payer: BC Managed Care – PPO | Attending: Emergency Medicine | Admitting: Emergency Medicine

## 2019-01-19 ENCOUNTER — Ambulatory Visit (INDEPENDENT_AMBULATORY_CARE_PROVIDER_SITE_OTHER): Payer: BC Managed Care – PPO

## 2019-01-19 ENCOUNTER — Ambulatory Visit (INDEPENDENT_AMBULATORY_CARE_PROVIDER_SITE_OTHER)
Admission: EM | Admit: 2019-01-19 | Discharge: 2019-01-19 | Disposition: A | Discharge status: Home / Self Care | Payer: BC Managed Care – PPO | Source: Home / Self Care | Attending: Family Medicine | Admitting: Family Medicine

## 2019-01-19 DIAGNOSIS — I10 Essential (primary) hypertension: Secondary | ICD-10-CM

## 2019-01-19 DIAGNOSIS — N209 Urinary calculus, unspecified: Secondary | ICD-10-CM | POA: Insufficient documentation

## 2019-01-19 DIAGNOSIS — M549 Dorsalgia, unspecified: Secondary | ICD-10-CM

## 2019-01-19 DIAGNOSIS — R109 Unspecified abdominal pain: Secondary | ICD-10-CM | POA: Diagnosis not present

## 2019-01-19 DIAGNOSIS — Z87442 Personal history of urinary calculi: Secondary | ICD-10-CM

## 2019-01-19 DIAGNOSIS — N201 Calculus of ureter: Secondary | ICD-10-CM

## 2019-01-19 DIAGNOSIS — R1012 Left upper quadrant pain: Secondary | ICD-10-CM | POA: Diagnosis present

## 2019-01-19 DIAGNOSIS — N23 Unspecified renal colic: Secondary | ICD-10-CM

## 2019-01-19 LAB — POCT URINALYSIS DIP (DEVICE)
Bilirubin Urine: NEGATIVE
Glucose, UA: NEGATIVE mg/dL
Ketones, ur: NEGATIVE mg/dL
Leukocytes,Ua: NEGATIVE
Nitrite: NEGATIVE
Protein, ur: NEGATIVE mg/dL
Specific Gravity, Urine: 1.025 (ref 1.005–1.030)
Urobilinogen, UA: 0.2 mg/dL (ref 0.0–1.0)
pH: 6 (ref 5.0–8.0)

## 2019-01-19 MED ORDER — KETOROLAC TROMETHAMINE 30 MG/ML IJ SOLN
INTRAMUSCULAR | Status: AC
  Filled 2019-01-19: qty 1

## 2019-01-19 MED ORDER — TAMSULOSIN HCL 0.4 MG PO CAPS: 0.4000 mg | ORAL_CAPSULE | Freq: Every day | ORAL | 0 refills | Status: DC

## 2019-01-19 MED ORDER — IBUPROFEN 600 MG PO TABS: 600.0000 mg | ORAL_TABLET | Freq: Three times a day (TID) | ORAL | 0 refills | Status: DC | PRN

## 2019-01-19 MED ORDER — KETOROLAC TROMETHAMINE 30 MG/ML IJ SOLN
30.0000 mg | Freq: Once | INTRAMUSCULAR | Status: AC
  Administered 2019-01-19: 30 mg via INTRAMUSCULAR

## 2019-01-19 MED ORDER — OXYCODONE-ACETAMINOPHEN 5-325 MG PO TABS
1.0000 | ORAL_TABLET | ORAL | Status: AC | PRN
  Administered 2019-01-19 – 2019-01-20 (×2): 1 via ORAL
  Filled 2019-01-19 (×2): qty 1

## 2019-01-19 NOTE — ED Triage Notes (Signed)
Patient presents to Urgent Care with complaints of left sided flank pain since yesterday. Patient reports he has had kidney stones in the past and so increased his water intake. Pt feels like he possibly passed a stone this morning, but then the pain came back a few hours later.

## 2019-01-19 NOTE — ED Triage Notes (Signed)
Pt reports known kidneys stones, left flank pain, seen at UC earlier today and given ibuprofen but the pain is worse now.

## 2019-01-19 NOTE — Discharge Instructions (Addendum)
Please take the ibuprofen for pain  Please strain your urine  Please seek immediate care if your pain becomes worse  You may want to see a urologist at some point.

## 2019-01-19 NOTE — ED Provider Notes (Signed)
[facility]    CSN: [id number] Arrival date & time: 01/19/19  1330     History   Chief Complaint Chief Complaint  Patient presents with  . Flank Pain    HPI Charles Rojas. is a 48 y.o. male.   He is presenting with left intermittent flank pain.  The pain is sharp and stabbing.  He has a history of nephrolithiasis.  The pain started yesterday.  Has taken Advil for the pain.  Denies any significant hematuria.  No trauma to the back.  No inciting events.  The pain is moderate to severe.  HPI  Past Medical History:  Diagnosis Date  . Erectile dysfunction   . Family history of prostate cancer   . H/O oral surgery   . Herpes    genital  . Hypertension   . Insomnia   . Obesity   . OSA (obstructive sleep apnea) 07/2017  . Renal stone 2019    Patient Active Problem List   Diagnosis Date Noted  . Fever 12/21/2018  . Body aches 12/21/2018  . Insulin resistance 09/14/2018  . Impaired fasting blood sugar 09/14/2018  . Keloid 09/14/2018  . OSA (obstructive sleep apnea) 08/23/2017  . Screen for colon cancer 07/06/2017  . Essential hypertension, benign 07/06/2017  . Snoring 07/06/2017  . Witnessed apneic spells 07/06/2017  . Chronic pain of left knee 01/17/2017  . Screening for prostate cancer 02/12/2016  . Obesity 02/12/2016  . Family history of prostate cancer in father 02/12/2016  . Encounter for health maintenance examination in adult 02/12/2016  . Genital herpes 02/12/2016  . Insomnia 02/12/2016  . Erectile dysfunction 02/12/2016  . Skin tag 02/12/2016    Past Surgical History:  Procedure Laterality Date  . APPENDECTOMY    . MOUTH SURGERY         Home Medications    Prior to Admission medications   Medication Sig Start Date End Date Taking? Authorizing Provider  amLODipine (NORVASC) 10 MG tablet Take 1 tablet (10 mg total) by mouth daily. 09/15/18 09/15/19  Tysinger, Camelia Eng, PA-C  ibuprofen (ADVIL) 600 MG tablet Take 1 tablet (600 mg total) by  mouth every 8 (eight) hours as needed. 01/19/19   Rosemarie Ax, MD  sildenafil (VIAGRA) 50 MG tablet Take 1 tablet (50 mg total) by mouth daily as needed for erectile dysfunction. 09/15/18   Tysinger, Camelia Eng, PA-C  tamsulosin (FLOMAX) 0.4 MG CAPS capsule Take 1 capsule (0.4 mg total) by mouth daily after breakfast. 01/19/19   Rosemarie Ax, MD  valACYclovir (VALTREX) 1000 MG tablet 2 tablets po BID x 1 day for flare up 09/15/18   Tysinger, Camelia Eng, PA-C    Family History Family History  Problem Relation Age of Onset  . Cancer Father        prostate, age 95  . Hyperlipidemia Father   . Diabetes Mother   . Hypertension Mother   . Osteoarthritis Mother        Knees  . Hyperlipidemia Sister   . Hypertension Sister   . Cancer Paternal Uncle        prostate  . Cancer Maternal Uncle        thyroid  . Heart disease Neg Hx   . Stroke Neg Hx     Social History Social History   Tobacco Use  . Smoking status: Never Smoker  . Smokeless tobacco: Never Used  Substance Use Topics  . Alcohol use: Yes    Comment: rarely  .  Drug use: No     Allergies   Patient has no known allergies.   Review of Systems Review of Systems  Constitutional: Negative for fever.  HENT: Negative for congestion.   Respiratory: Negative for cough.   Cardiovascular: Negative for chest pain.  Genitourinary: Positive for flank pain. Negative for dysuria.  Musculoskeletal: Positive for back pain.  Skin: Negative for color change.  Neurological: Negative for weakness.  Hematological: Negative for adenopathy.     Physical Exam Triage Vital Signs ED Triage Vitals  Enc Vitals Group     BP 01/19/19 1349 (!) 149/96     Pulse Rate 01/19/19 1349 79     Resp 01/19/19 1349 17     Temp 01/19/19 1349 97.8 F (36.6 C)     Temp Source 01/19/19 1349 Oral     SpO2 01/19/19 1349 98 %     Weight --      Height --      Head Circumference --      Peak Flow --      Pain Score 01/19/19 1347 4     Pain Loc --       Pain Edu? --      Excl. in [place]? --    No data found.  Updated Vital Signs BP (!) 149/96 (BP Location: Left Arm)   Pulse 79   Temp 97.8 F (36.6 C) (Oral)   Resp 17   SpO2 98%   Visual Acuity Right Eye Distance:   Left Eye Distance:   Bilateral Distance:    Right Eye Near:   Left Eye Near:    Bilateral Near:     Physical Exam Gen: NAD, alert, cooperative with exam, well-appearing ENT: normal lips, normal nasal mucosa,  Eye: normal EOM, normal conjunctiva and lids CV:  no edema, +2 pedal pulses   Resp: no accessory muscle use, non-labored,  GI: no masses or tenderness, no hernia  Skin: no rashes, no areas of induration  Neuro: normal tone, normal sensation to touch Psych:  normal insight, alert and oriented MSK: No CVA tenderness, normal gait.   UC Treatments / Results  Labs (all labs ordered are listed, but only abnormal results are displayed) Labs Reviewed  POCT URINALYSIS DIP (DEVICE) - Abnormal; Notable for the following components:      Result Value   Hgb urine dipstick MODERATE (*)    All other components within normal limits    EKG   Radiology Dg Abd 1 View  Result Date: 01/19/2019 CLINICAL DATA:  Left-sided some pain. EXAM: ABDOMEN - 1 VIEW COMPARISON:  None. FINDINGS: The bowel gas pattern is normal. No radio-opaque calculi or other significant radiographic abnormality are seen. IMPRESSION: Negative. Electronically Signed   By: Dorise Bullion III M.D   On: 01/19/2019 14:34    Procedures Procedures (including critical care time)  Medications Ordered in UC Medications  ketorolac (TORADOL) 30 MG/ML injection 30 mg (30 mg Intramuscular Given 01/19/19 1441)  ketorolac (TORADOL) 30 MG/ML injection (has no administration in time range)    Initial Impression / Assessment and Plan / UC Course  I have reviewed the triage vital signs and the nursing notes.  Pertinent labs & imaging results that were available during my care of the patient were reviewed  by me and considered in my medical decision making (see chart for details).     Charles Rojas is a 48 year old male that is presenting with likely renal colic.  Moderate hemoglobin was seen on urinalysis.  KUB was normal.  Provided with Toradol injection.  Was sent home with ibuprofen and Flomax.  Counseled on straining.  Counseled on supportive care.  Given indications to follow-up and return.  May need to see urologist at some point.  Final Clinical Impressions(s) / UC Diagnoses   Final diagnoses:  Renal colic on left side     Discharge Instructions     Please take the ibuprofen for pain  Please strain your urine  Please seek immediate care if your pain becomes worse  You may want to see a urologist at some point.     ED Prescriptions    Medication Sig Dispense Auth. Provider   tamsulosin (FLOMAX) 0.4 MG CAPS capsule Take 1 capsule (0.4 mg total) by mouth daily after breakfast. 90 capsule Rosemarie Ax, MD   ibuprofen (ADVIL) 600 MG tablet Take 1 tablet (600 mg total) by mouth every 8 (eight) hours as needed. 30 tablet Rosemarie Ax, MD     Controlled Substance Prescriptions [place] Controlled Substance Registry consulted? Not Applicable   Rosemarie Ax, MD 01/19/19 [phone]

## 2019-01-20 ENCOUNTER — Emergency Department (HOSPITAL_COMMUNITY): Payer: BC Managed Care – PPO

## 2019-01-20 LAB — URINALYSIS, ROUTINE W REFLEX MICROSCOPIC
Bilirubin Urine: NEGATIVE
Glucose, UA: NEGATIVE mg/dL
Ketones, ur: NEGATIVE mg/dL
Leukocytes,Ua: NEGATIVE
Nitrite: NEGATIVE
Protein, ur: NEGATIVE mg/dL
Specific Gravity, Urine: 1.003 — ABNORMAL LOW (ref 1.005–1.030)
pH: 6 (ref 5.0–8.0)

## 2019-01-20 LAB — BASIC METABOLIC PANEL
Anion gap: 9 (ref 5–15)
BUN: 18 mg/dL (ref 6–20)
CO2: 23 mmol/L (ref 22–32)
Calcium: 8.8 mg/dL — ABNORMAL LOW (ref 8.9–10.3)
Chloride: 100 mmol/L (ref 98–111)
Creatinine, Ser: 1.34 mg/dL — ABNORMAL HIGH (ref 0.61–1.24)
GFR calc Af Amer: >60 mL/min (ref >60)
GFR calc non Af Amer: >60 mL/min (ref >60)
Glucose, Bld: 107 mg/dL — ABNORMAL HIGH (ref 70–99)
Potassium: 4 mmol/L (ref 3.5–5.1)
Sodium: 132 mmol/L — ABNORMAL LOW (ref 135–145)

## 2019-01-20 LAB — CBC WITH DIFFERENTIAL/PLATELET
Abs Immature Granulocytes: 0.03 10*3/uL (ref 0.00–0.07)
Basophils Absolute: 0 10*3/uL (ref 0.0–0.1)
Basophils Relative: 0 %
Eosinophils Absolute: 0.1 10*3/uL (ref 0.0–0.5)
Eosinophils Relative: 1 %
HCT: 40.6 % (ref 39.0–52.0)
Hemoglobin: 12.4 g/dL — ABNORMAL LOW (ref 13.0–17.0)
Immature Granulocytes: 0 %
Lymphocytes Relative: 30 %
Lymphs Abs: 2.8 10*3/uL (ref 0.7–4.0)
MCH: 23.9 pg — ABNORMAL LOW (ref 26.0–34.0)
MCHC: 30.5 g/dL (ref 30.0–36.0)
MCV: 78.2 fL — ABNORMAL LOW (ref 80.0–100.0)
Monocytes Absolute: 1.2 10*3/uL — ABNORMAL HIGH (ref 0.1–1.0)
Monocytes Relative: 13 %
Neutro Abs: 5.2 10*3/uL (ref 1.7–7.7)
Neutrophils Relative %: 56 %
Platelets: 294 10*3/uL (ref 150–400)
RBC: 5.19 MIL/uL (ref 4.22–5.81)
RDW: 14.3 % (ref 11.5–15.5)
WBC: 9.3 10*3/uL (ref 4.0–10.5)
nRBC: 0 % (ref 0.0–0.2)

## 2019-01-20 MED ORDER — KETOROLAC TROMETHAMINE 30 MG/ML IJ SOLN
30.0000 mg | Freq: Once | INTRAMUSCULAR | Status: AC
  Administered 2019-01-20: 30 mg via INTRAMUSCULAR

## 2019-01-20 MED ORDER — OXYCODONE-ACETAMINOPHEN 5-325 MG PO TABS: 1.0000 | ORAL_TABLET | ORAL | 0 refills | Status: DC | PRN

## 2019-01-20 MED ORDER — KETOROLAC TROMETHAMINE 30 MG/ML IJ SOLN
30.0000 mg | Freq: Once | INTRAMUSCULAR | Status: DC
  Filled 2019-01-20: qty 1

## 2019-01-20 NOTE — ED Provider Notes (Signed)
[facility] EMERGENCY DEPARTMENT Provider Note   CSN: [id number] Arrival date & time: 01/19/19  2230    History   Chief Complaint Chief Complaint  Patient presents with   Nephrolithiasis    HPI Charles Rojas. is a 48 y.o. male.     Patient presents to the emergency department for evaluation of left-sided flank pain.  Patient reports that symptoms have been ongoing for several days.  Pain has been waxing and waning.  He was seen at urgent care and told he might have a kidney stone.  He was placed on Flomax and ibuprofen.  Tonight the pain significantly worsened.     Past Medical History:  Diagnosis Date   Erectile dysfunction    Family history of prostate cancer    H/O oral surgery    Herpes    genital   Hypertension    Insomnia    Obesity    OSA (obstructive sleep apnea) 07/2017   Renal stone 2019    Patient Active Problem List   Diagnosis Date Noted   Fever 12/21/2018   Body aches 12/21/2018   Insulin resistance 09/14/2018   Impaired fasting blood sugar 09/14/2018   Keloid 09/14/2018   OSA (obstructive sleep apnea) 08/23/2017   Screen for colon cancer 07/06/2017   Essential hypertension, benign 07/06/2017   Snoring 07/06/2017   Witnessed apneic spells 07/06/2017   Chronic pain of left knee 01/17/2017   Screening for prostate cancer 02/12/2016   Obesity 02/12/2016   Family history of prostate cancer in father 02/12/2016   Encounter for health maintenance examination in adult 02/12/2016   Genital herpes 02/12/2016   Insomnia 02/12/2016   Erectile dysfunction 02/12/2016   Skin tag 02/12/2016    Past Surgical History:  Procedure Laterality Date   APPENDECTOMY     MOUTH SURGERY          Home Medications    Prior to Admission medications   Medication Sig Start Date End Date Taking? Authorizing Provider  amLODipine (NORVASC) 10 MG tablet Take 1 tablet (10 mg total) by mouth daily. 09/15/18 09/15/19   Tysinger, Camelia Eng, PA-C  ibuprofen (ADVIL) 600 MG tablet Take 1 tablet (600 mg total) by mouth every 8 (eight) hours as needed. 01/19/19   Rosemarie Ax, MD  oxyCODONE-acetaminophen (PERCOCET) 5-325 MG tablet Take 1-2 tablets by mouth every 4 (four) hours as needed. 01/20/19   Orpah Greek, MD  sildenafil (VIAGRA) 50 MG tablet Take 1 tablet (50 mg total) by mouth daily as needed for erectile dysfunction. 09/15/18   Tysinger, Camelia Eng, PA-C  tamsulosin (FLOMAX) 0.4 MG CAPS capsule Take 1 capsule (0.4 mg total) by mouth daily after breakfast. 01/19/19   Rosemarie Ax, MD  valACYclovir (VALTREX) 1000 MG tablet 2 tablets po BID x 1 day for flare up 09/15/18   Tysinger, Camelia Eng, PA-C    Family History Family History  Problem Relation Age of Onset   Cancer Father        prostate, age 69   Hyperlipidemia Father    Diabetes Mother    Hypertension Mother    Osteoarthritis Mother        Knees   Hyperlipidemia Sister    Hypertension Sister    Cancer Paternal Uncle        prostate   Cancer Maternal Uncle        thyroid   Heart disease Neg Hx    Stroke Neg Hx     Social  History Social History   Tobacco Use   Smoking status: Never Smoker   Smokeless tobacco: Never Used  Substance Use Topics   Alcohol use: Yes    Comment: rarely   Drug use: No     Allergies   Patient has no known allergies.   Review of Systems Review of Systems  Genitourinary: Positive for flank pain.  All other systems reviewed and are negative.    Physical Exam Updated Vital Signs BP (!) 142/89    Pulse 63    Temp 98.2 F (36.8 C) (Oral)    Resp 16    SpO2 99%   Physical Exam Vitals signs and nursing note reviewed.  Constitutional:      General: He is not in acute distress.    Appearance: Normal appearance. He is well-developed.  HENT:     Head: Normocephalic and atraumatic.     Right Ear: Hearing normal.     Left Ear: Hearing normal.     Nose: Nose normal.  Eyes:      Conjunctiva/sclera: Conjunctivae normal.     Pupils: Pupils are equal, round, and reactive to light.  Neck:     Musculoskeletal: Normal range of motion and neck supple.  Cardiovascular:     Rate and Rhythm: Regular rhythm.     Heart sounds: S1 normal and S2 normal. No murmur. No friction rub. No gallop.   Pulmonary:     Effort: Pulmonary effort is normal. No respiratory distress.     Breath sounds: Normal breath sounds.  Chest:     Chest wall: No tenderness.  Abdominal:     General: Bowel sounds are normal.     Palpations: Abdomen is soft.     Tenderness: There is no abdominal tenderness. There is no guarding or rebound. Negative signs include Murphy's sign and McBurney's sign.     Hernia: No hernia is present.  Musculoskeletal: Normal range of motion.  Skin:    General: Skin is warm and dry.     Findings: No rash.  Neurological:     Mental Status: He is alert and oriented to person, place, and time.     GCS: GCS eye subscore is 4. GCS verbal subscore is 5. GCS motor subscore is 6.     Cranial Nerves: No cranial nerve deficit.     Sensory: No sensory deficit.     Coordination: Coordination normal.  Psychiatric:        Speech: Speech normal.        Behavior: Behavior normal.        Thought Content: Thought content normal.      ED Treatments / Results  Labs (all labs ordered are listed, but only abnormal results are displayed) Labs Reviewed  CBC WITH DIFFERENTIAL/PLATELET - Abnormal; Notable for the following components:      Result Value   Hemoglobin 12.4 (*)    MCV 78.2 (*)    MCH 23.9 (*)    Monocytes Absolute 1.2 (*)    All other components within normal limits  BASIC METABOLIC PANEL - Abnormal; Notable for the following components:   Sodium 132 (*)    Glucose, Bld 107 (*)    Creatinine, Ser 1.34 (*)    Calcium 8.8 (*)    All other components within normal limits  URINALYSIS, ROUTINE W REFLEX MICROSCOPIC - Abnormal; Notable for the following components:   Color,  Urine COLORLESS (*)    Specific Gravity, Urine 1.003 (*)    Hgb urine dipstick  LARGE (*)    Bacteria, UA RARE (*)    All other components within normal limits    EKG None  Radiology Dg Abd 1 View  Result Date: 01/19/2019 CLINICAL DATA:  Left-sided some pain. EXAM: ABDOMEN - 1 VIEW COMPARISON:  None. FINDINGS: The bowel gas pattern is normal. No radio-opaque calculi or other significant radiographic abnormality are seen. IMPRESSION: Negative. Electronically Signed   By: Dorise Bullion III M.D   On: 01/19/2019 14:34   Ct Renal Stone Study  Result Date: 01/20/2019 CLINICAL DATA:  Left-sided flank pain EXAM: CT ABDOMEN AND PELVIS WITHOUT CONTRAST TECHNIQUE: Multidetector CT imaging of the abdomen and pelvis was performed following the standard protocol without IV contrast. COMPARISON:  01/30/2011 FINDINGS: Lower chest: No acute abnormality. Hepatobiliary: No focal liver abnormality is seen. No gallstones, gallbladder wall thickening, or biliary dilatation. Pancreas: Unremarkable. No pancreatic ductal dilatation or surrounding inflammatory changes. Spleen: Normal in size without focal abnormality. Adrenals/Urinary Tract: Adrenal glands are normal. Mild left hydronephrosis and hydroureter, secondary to a punctate 1-2 mm stone at the left UVJ. Stomach/Bowel: Stomach is within normal limits. Status post appendectomy. No evidence of bowel wall thickening, distention, or inflammatory changes. Vascular/Lymphatic: No significant vascular findings are present. No enlarged abdominal or pelvic lymph nodes. Reproductive: Prostate is unremarkable. Other: Negative for free air or free fluid. Fat in the umbilical region protuberant soft tissue at the level of the umbilicus. Musculoskeletal: No acute or significant osseous findings. IMPRESSION: 1. Mild left hydronephrosis and hydroureter, secondary to a punctate 1-2 mm stone at the left UVJ. 2. Otherwise negative CT of the abdomen and pelvis without contrast  Electronically Signed   By: Donavan Foil M.D.   On: 01/20/2019 02:49    Procedures Procedures (including critical care time)  Medications Ordered in ED Medications  oxyCODONE-acetaminophen (PERCOCET/ROXICET) 5-325 MG per tablet 1 tablet (1 tablet Oral Given 01/20/19 0250)  ketorolac (TORADOL) 30 MG/ML injection 30 mg (30 mg Intramuscular Given 01/20/19 0442)     Initial Impression / Assessment and Plan / ED Course  I have reviewed the triage vital signs and the nursing notes.  Pertinent labs & imaging results that were available during my care of the patient were reviewed by me and considered in my medical decision making (see chart for details).        Patient presents with left-sided flank pain.  Urinalysis shows blood but no signs of infection.  CT scan does confirm distal ureteral stone, 1 to 2 mm.  Patient pain-free at the moment here in the ER.  He possibly passed it although he has had intermittent episodes of pain relief prior to arrival in the ER.  He was therefore given limited supply of analgesia, will follow up with urology.  Final Clinical Impressions(s) / ED Diagnoses   Final diagnoses:  Ureterolithiasis    ED Discharge Orders         Ordered    oxyCODONE-acetaminophen (PERCOCET) 5-325 MG tablet  Every 4 hours PRN     01/20/19 0446           Orpah Greek, MD 01/20/19 [phone]

## 2019-04-10 ENCOUNTER — Other Ambulatory Visit: Payer: Self-pay | Admitting: Family Medicine

## 2019-05-16 LAB — HM COLONOSCOPY

## 2019-05-22 ENCOUNTER — Encounter: Payer: Self-pay | Admitting: Medical

## 2019-05-30 ENCOUNTER — Other Ambulatory Visit: Payer: Self-pay

## 2019-10-03 ENCOUNTER — Encounter: Payer: Self-pay | Admitting: Medical

## 2019-10-03 ENCOUNTER — Ambulatory Visit: Payer: BC Managed Care – PPO | Admitting: Medical

## 2019-10-03 ENCOUNTER — Other Ambulatory Visit: Payer: Self-pay

## 2019-10-03 VITALS — BP 138/90 | HR 84 | Temp 97.8°F | Ht 72.0 in | Wt 242.8 lb

## 2019-10-03 DIAGNOSIS — R7301 Impaired fasting glucose: Secondary | ICD-10-CM

## 2019-10-03 DIAGNOSIS — G4733 Obstructive sleep apnea (adult) (pediatric): Secondary | ICD-10-CM

## 2019-10-03 DIAGNOSIS — I10 Essential (primary) hypertension: Secondary | ICD-10-CM | POA: Diagnosis not present

## 2019-10-03 DIAGNOSIS — R079 Chest pain, unspecified: Secondary | ICD-10-CM | POA: Diagnosis not present

## 2019-10-03 DIAGNOSIS — N529 Male erectile dysfunction, unspecified: Secondary | ICD-10-CM | POA: Diagnosis not present

## 2019-10-03 DIAGNOSIS — E8881 Metabolic syndrome: Secondary | ICD-10-CM

## 2019-10-03 DIAGNOSIS — R6882 Decreased libido: Secondary | ICD-10-CM | POA: Diagnosis not present

## 2019-10-03 MED ORDER — ASPIRIN EC 81 MG PO TBEC: 81.0000 mg | DELAYED_RELEASE_TABLET | Freq: Every day | ORAL | 3 refills | Status: DC

## 2019-10-03 MED ORDER — AMLODIPINE-OLMESARTAN 10-20 MG PO TABS: 1.0000 | ORAL_TABLET | Freq: Every day | ORAL | 3 refills | Status: DC

## 2019-10-03 MED ORDER — ROSUVASTATIN CALCIUM 10 MG PO TABS: 10.0000 mg | ORAL_TABLET | Freq: Every day | ORAL | 3 refills | Status: DC

## 2019-10-03 NOTE — Progress Notes (Addendum)
Subjective: Chief Complaint  Patient presents with  . Chest Pain    since Sunday    Here for some chest discomfort.   Was having intermittent pains in chest Sunday and Monday 2-3 days ago.  Pains would last seconds.   Pains have been intermittent throughout the day.  Pains were in left chest.  No associated sweats, nausea, dizziness, no numbness or tingling.  He was at rest when he felt this.   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  doesn't recall injury, but did do a brake job on heavy break drums last week, so this could have given some muscle soreness.     In recent months he has had problems getting and maintaining erections.  He got married in December.  Wife is concerned about his erection problems.  He also notes low libido.  No other aggravating or relieving factors. No other complaint.  Past Medical History:  Diagnosis Date  . Erectile dysfunction   . Family history of prostate cancer   . H/O oral surgery   . Herpes    genital  . Hypertension   . Insomnia   . Obesity   . OSA (obstructive sleep apnea) 07/2017  . Renal stone 2019   Current Outpatient Medications on File Prior to Visit  Medication Sig Dispense Refill  . sildenafil (VIAGRA) 50 MG tablet Take 1 tablet (50 mg total) by mouth daily as needed for erectile dysfunction. 10 tablet 11  . ibuprofen (ADVIL) 600 MG tablet Take 1 tablet (600 mg total) by mouth every 8 (eight) hours as needed. (Patient not taking: Reported on 10/03/2019) 30 tablet 0  . oxyCODONE-acetaminophen (PERCOCET) 5-325 MG tablet Take 1-2 tablets by mouth every 4 (four) hours as needed. (Patient not taking: Reported on 10/03/2019) 10 tablet 0  . tamsulosin (FLOMAX) 0.4 MG CAPS capsule Take 1 capsule (0.4 mg total) by mouth daily after breakfast. (Patient not taking: Reported on 10/03/2019) 90 capsule 0  . valACYclovir (VALTREX) 1000 MG tablet 2 tablets po BID x 1 day for flare up (Patient not taking: Reported on  10/03/2019) 20 tablet 3   No current facility-administered medications on file prior to visit.   Family History  Problem Relation Age of Onset  . Cancer Father        prostate, age 44  . Hyperlipidemia Father   . Diabetes Mother   . Hypertension Mother   . Osteoarthritis Mother        Knees  . Hyperlipidemia Sister   . Hypertension Sister   . Cancer Paternal Uncle        prostate  . Cancer Maternal Uncle        thyroid  . Heart disease Neg Hx   . Stroke Neg Hx    Past Surgical History:  Procedure Laterality Date  . APPENDECTOMY    . MOUTH SURGERY       ROS as in subjective    Objective: BP 138/90   Pulse 84   Temp 97.8 F (36.6 C)   Ht 6' (1.829 m)   Wt 242 lb 12.8 oz (110.1 kg)   SpO2 96%   BMI 32.93 kg/m   Wt Readings from Last 3 Encounters:  10/03/19 242 lb 12.8 oz (110.1 kg)  12/21/18 230 lb (104.3 kg)  09/14/18 234 lb 12.8 oz (106.5 kg)   BP Readings from Last 3 Encounters:  10/03/19 138/90  01/20/19 (!) 142/89  01/19/19 (!) 149/96  General appearence: alert, no distress, WD/WN,  Neck: supple, no lymphadenopathy, no thyromegaly, no masses, no bruits Chest wall nontender, no pain with arm range of motion shoulder range of motion Heart: RRR, normal S1, S2, no murmurs Lungs: CTA bilaterally, no wheezes, rhonchi, or rales Abdomen: +bs, soft, non tender, non distended, no masses, no hepatomegaly, no splenomegaly Pulses: 2+ symmetric, upper and lower extremities, normal cap refill No edema   EKG indication chest pain Rate 69 bpm, PR 124 ms, QRS 94 ms, QTC 387 ms, axis 63 degrees, normal sinus rhythm nonspecific T wave inversion in 3.     Assessment Encounter Diagnoses  Name Primary?  . Erectile dysfunction, unspecified erectile dysfunction type Yes  . Low libido   . Chest pain, unspecified type   . Essential hypertension, benign   . OSA (obstructive sleep apnea)   . Impaired fasting blood sugar   . Insulin resistance      Plan: His EKG  today is unchanged.  His vital signs are stable.  I think his current symptoms are nonspecific and atypical.  We discussed cardiac symptoms that would prompt a call to 911  We discussed his overall risk factors for heart disease, health, blood pressure and new symptoms of erectile dysfunction  His ASCVD risk factor calculation she has a 10-year heart disease risk over 10%.   I recommend he take a baby aspirin and statin nightly.  Change from amlodipine 5 mg to amlodipine/olmesartan 10/20 mg daily.  We discussed potentially doing cardiac CT calcium score, stress test or just referred to cardiology and let them take it from here particular in light of the new erectile dysfunction symptoms.  ED-he did have borderline low testosterone several years ago.  Recheck this today.  Counseled on exercise, low-salt diet, healthy diet, losing weight.  Impaired glucose-reiterated recommendations above  [place] was seen today for chest pain.  Diagnoses and all orders for this visit:  Erectile dysfunction, unspecified erectile dysfunction type -     Testosterone  Low libido -     Testosterone  Chest pain, unspecified type  Essential hypertension, benign  OSA (obstructive sleep apnea)  Impaired fasting blood sugar  Insulin resistance  Other orders -     rosuvastatin (CRESTOR) 10 MG tablet; Take 1 tablet (10 mg total) by mouth at bedtime. -     aspirin EC 81 MG tablet; Take 1 tablet (81 mg total) by mouth daily. -     amlodipine-olmesartan (AZOR) 10-20 MG tablet; Take 1 tablet by mouth daily.  Follow-up pending labs, return soon for physical, likely referral to cardiology

## 2019-10-04 LAB — TESTOSTERONE: Testosterone: 292 ng/dL (ref 264–916)

## 2019-10-09 ENCOUNTER — Other Ambulatory Visit: Payer: Self-pay

## 2019-10-09 DIAGNOSIS — R079 Chest pain, unspecified: Secondary | ICD-10-CM

## 2019-10-09 DIAGNOSIS — I10 Essential (primary) hypertension: Secondary | ICD-10-CM

## 2019-10-09 NOTE — Progress Notes (Unsigned)
Na

## 2019-10-22 ENCOUNTER — Encounter: Payer: Self-pay | Admitting: Podiatrist

## 2019-10-22 ENCOUNTER — Other Ambulatory Visit: Payer: Self-pay

## 2019-10-22 ENCOUNTER — Other Ambulatory Visit: Payer: Self-pay | Admitting: Podiatrist

## 2019-10-22 ENCOUNTER — Ambulatory Visit (INDEPENDENT_AMBULATORY_CARE_PROVIDER_SITE_OTHER): Payer: BC Managed Care – PPO

## 2019-10-22 ENCOUNTER — Ambulatory Visit: Payer: BC Managed Care – PPO | Admitting: Podiatrist

## 2019-10-22 VITALS — Temp 97.0°F

## 2019-10-22 DIAGNOSIS — M778 Other enthesopathies, not elsewhere classified: Secondary | ICD-10-CM

## 2019-10-22 DIAGNOSIS — M79672 Pain in left foot: Secondary | ICD-10-CM

## 2019-10-22 MED ORDER — NONFORMULARY OR COMPOUNDED ITEM: 3 refills | Status: DC

## 2019-10-22 NOTE — Progress Notes (Signed)
   Chief Complaint  Patient presents with  . Foot Pain    L midfoot, lateral side. x1 month. Pt stated, "I've been trying to exercise more. Pain = 7/10 when I walk/exercise. I soak in hot water w/ Epsom salt, and I've tried Voltaren gel. Voltaren seems to help".      HPI: Patient is 49 y.o. male who presents today for foot pain on the left midfoot for approximately 1 month duration.  Patient relates he would like to exercise however the foot pain has stopped him from doing so.  Review of Systems  DATA OBTAINED: from patient  GENERAL: Feels well no fevers, no fatigue, no changes in appetite SKIN: No itching, no rashes, no open wounds EYES: No eye pain,no redness, no discharge EARS: No earache,no ringing of ears, NOSE: No congestion, no drainage, no bleeding  MOUTH/THROAT: No mouth pain, No sore throat, No difficulty chewing or swallowing  RESPIRATORY: No cough, no wheezing, no SOB CARDIAC: No chest pain,no heart palpitations, GI: No abdominal pain, No Nausea, no vomiting, no diarrhea, no heartburn or no reflux  GU: No dysuria, no increased frequency or urgency MUSCULOSKELETAL: No unrelieved bone/joint pain,  NEUROLOGIC: Awake, alert, appropriate to situation, No change in mental status. PSYCHIATRIC: No overt anxiety or sadness.No behavior issue.      Physical Exam  GENERAL APPEARANCE: Alert, conversant. Appropriately groomed. No acute distress.   VASCULAR: Pedal pulses palpable DP and PT bilateral.  Capillary refill time is immediate to all digits,  Proximal to distal cooling it warm to warm.  Digital hair growth is present bilateral   NEUROLOGIC: sensation is intact epicritically and protectively to 5.07 monofilament at 5/5 sites bilateral.  Light touch is intact bilateral, vibratory sensation intact bilateral, achilles tendon reflex is intact bilateral.   MUSCULOSKELETAL: acceptable muscle strength, tone and stability bilateral.  Intrinsic muscluature intact bilateral.  Range of  motion at ankle and first MPJ is normal bilateral.  Pain on palpation at the fifth metatarsal base region and proximally is noted.  DERMATOLOGIC: skin is warm, supple, and dry.  No open lesions noted.  No interdigital maceration noted bilateral.  Digital nails are asymptomatifc.    X-ray evaluation 3 views of the left foot are obtained.  No sign of fracture or dislocation is noted normal osseous mineralization is seen.  Assessment     ICD-10-CM   1. Left foot pain  M79.672 CANCELED: DG Foot Complete Left  2. Capsulitis of left foot  M77.8      Plan  Discussed treatment options and alternatives.  Recommended new running shoes to be used his walking shoes for his new exercise program.  Also recommended a steroid injection discussing the risks and benefits.  The patient agreed and wishes to proceed.  The foot was prepped with alcohol and an injection consisting of dexamethasone and Marcaine plain was infiltrated into the area of maximal tenderness on the left foot.  Being careful to stay superficial with the injection.  A removable brace was applied to the foot he was instructed to wear this for the next week.  Also discussed the positive long-term benefits of orthotics should the brace be beneficial.  I also ordered him a pain cream from [facility] to use on the foot as well.  He will be seen back as needed for follow-up.

## 2019-10-22 NOTE — Patient Instructions (Signed)
I have ordered a medication for you that will come from [facility] in [place]. They should be calling you to verify insurance and will mail the medication to you. If you live close by then you can go by their pharmacy to pick up the medication. Their phone number is [phone]. If you do not hear from them in the next few days, please give [place] a call at [phone].    [employer] and [employer] sports are great for running shoes-

## 2019-10-27 ENCOUNTER — Other Ambulatory Visit: Payer: Self-pay | Admitting: Medical

## 2019-11-02 ENCOUNTER — Encounter: Payer: Self-pay | Admitting: Medical

## 2019-11-02 ENCOUNTER — Other Ambulatory Visit: Payer: Self-pay

## 2019-11-02 ENCOUNTER — Ambulatory Visit: Payer: BC Managed Care – PPO | Admitting: Medical

## 2019-11-02 VITALS — BP 128/78 | HR 90 | Temp 97.8°F | Ht 72.0 in | Wt 241.8 lb

## 2019-11-02 DIAGNOSIS — Z125 Encounter for screening for malignant neoplasm of prostate: Secondary | ICD-10-CM

## 2019-11-02 DIAGNOSIS — E8881 Metabolic syndrome: Secondary | ICD-10-CM

## 2019-11-02 DIAGNOSIS — A6000 Herpesviral infection of urogenital system, unspecified: Secondary | ICD-10-CM

## 2019-11-02 DIAGNOSIS — K219 Gastro-esophageal reflux disease without esophagitis: Secondary | ICD-10-CM

## 2019-11-02 DIAGNOSIS — R6882 Decreased libido: Secondary | ICD-10-CM

## 2019-11-02 DIAGNOSIS — E669 Obesity, unspecified: Secondary | ICD-10-CM

## 2019-11-02 DIAGNOSIS — N529 Male erectile dysfunction, unspecified: Secondary | ICD-10-CM

## 2019-11-02 DIAGNOSIS — L918 Other hypertrophic disorders of the skin: Secondary | ICD-10-CM

## 2019-11-02 DIAGNOSIS — R0683 Snoring: Secondary | ICD-10-CM

## 2019-11-02 DIAGNOSIS — I10 Essential (primary) hypertension: Secondary | ICD-10-CM | POA: Diagnosis not present

## 2019-11-02 DIAGNOSIS — G4700 Insomnia, unspecified: Secondary | ICD-10-CM

## 2019-11-02 DIAGNOSIS — Z0000 Encounter for general adult medical examination without abnormal findings: Secondary | ICD-10-CM

## 2019-11-02 DIAGNOSIS — E88819 Insulin resistance, unspecified: Secondary | ICD-10-CM

## 2019-11-02 DIAGNOSIS — L910 Hypertrophic scar: Secondary | ICD-10-CM

## 2019-11-02 DIAGNOSIS — R7989 Other specified abnormal findings of blood chemistry: Secondary | ICD-10-CM

## 2019-11-02 DIAGNOSIS — Z8042 Family history of malignant neoplasm of prostate: Secondary | ICD-10-CM

## 2019-11-02 DIAGNOSIS — G4733 Obstructive sleep apnea (adult) (pediatric): Secondary | ICD-10-CM | POA: Diagnosis not present

## 2019-11-02 DIAGNOSIS — R7301 Impaired fasting glucose: Secondary | ICD-10-CM

## 2019-11-02 MED ORDER — OMEPRAZOLE 40 MG PO CPDR
40.0000 mg | DELAYED_RELEASE_CAPSULE | Freq: Every day | ORAL | 1 refills | Status: DC
Start: 2019-11-02 — End: 2020-04-09

## 2019-11-02 NOTE — Progress Notes (Signed)
Subjective:   HPI  Charles Rojas. is a 49 y.o. male who presents for Chief Complaint  Patient presents with  . Annual Exam    non fasting     Patient Care Team: Dontee Jaso, Camelia Eng, PA-C as PCP - General (Family Medicine) Sees dentist Sees eye doctor  Concerns: Here to recheck testosterone.  Last visit had low initial reading, having ED issues, low libido.  Takes Sildenafil 3 tablets per time (generic).   This works ok.    compliant with the new BP and initiating cholesterol medication added last visit.  Uses valtrex prn.  Having some heartburn occasional  Sometimes has to eat late in evening.   Reviewed their medical, surgical, family, social, medication, and allergy history and updated chart as appropriate.  Past Medical History:  Diagnosis Date  . Erectile dysfunction   . Family history of prostate cancer   . H/O oral surgery   . Herpes    genital  . Hypertension   . Insomnia   . Obesity   . OSA (obstructive sleep apnea) 07/2017  . Renal stone 2019    Past Surgical History:  Procedure Laterality Date  . APPENDECTOMY    . MOUTH SURGERY      Social History   Socioeconomic History  . Marital status: Legally Separated    Spouse name: Not on file  . Number of children: Not on file  . Years of education: Not on file  . Highest education level: Not on file  Occupational History  . Not on file  Tobacco Use  . Smoking status: Never Smoker  . Smokeless tobacco: Never Used  Substance and Sexual Activity  . Alcohol use: Yes    Comment: rarely  . Drug use: No  . Sexual activity: Not on file  Other Topics Concern  . Not on file  Social History Narrative   Remarried 11/2017.   Divorced, has girlfriend, has 2 children, lives with girlfriend. Is a Dealer, works on school buses and trucks.  Exercise - cycling.   09/2018.   Social Determinants of Health   Financial Resource Strain:   . Difficulty of Paying Living Expenses:   Food Insecurity:   . Worried  About Charity fundraiser in the Last Year:   . Arboriculturist in the Last Year:   Transportation Needs:   . Film/video editor (Medical):   Marland Kitchen Lack of Transportation (Non-Medical):   Physical Activity:   . Days of Exercise per Week:   . Minutes of Exercise per Session:   Stress:   . Feeling of Stress :   Social Connections:   . Frequency of Communication with Friends and Family:   . Frequency of Social Gatherings with Friends and Family:   . Attends Religious Services:   . Active Member of Clubs or Organizations:   . Attends Archivist Meetings:   Marland Kitchen Marital Status:   Intimate Partner Violence:   . Fear of Current or Ex-Partner:   . Emotionally Abused:   Marland Kitchen Physically Abused:   . Sexually Abused:     Family History  Problem Relation Age of Onset  . Cancer Father        prostate, age 21  . Hyperlipidemia Father   . Diabetes Mother   . Hypertension Mother   . Osteoarthritis Mother        Knees  . Hyperlipidemia Sister   . Hypertension Sister   . Cancer Paternal Uncle  prostate  . Cancer Maternal Uncle        thyroid  . Heart disease Neg Hx   . Stroke Neg Hx      Current Outpatient Medications:  .  amlodipine-olmesartan (AZOR) 10-20 MG tablet, Take 1 tablet by mouth daily., Disp: 90 tablet, Rfl: 3 .  aspirin EC 81 MG tablet, Take 1 tablet (81 mg total) by mouth daily., Disp: 90 tablet, Rfl: 3 .  ibuprofen (ADVIL) 600 MG tablet, Take 1 tablet (600 mg total) by mouth every 8 (eight) hours as needed., Disp: 30 tablet, Rfl: 0 .  rosuvastatin (CRESTOR) 10 MG tablet, Take 1 tablet (10 mg total) by mouth at bedtime., Disp: 90 tablet, Rfl: 3 .  sildenafil (VIAGRA) 50 MG tablet, Take 1 tablet (50 mg total) by mouth daily as needed for erectile dysfunction., Disp: 10 tablet, Rfl: 11 .  valACYclovir (VALTREX) 1000 MG tablet, 2 tablets po BID x 1 day for flare up, Disp: 20 tablet, Rfl: 3 .  NONFORMULARY OR COMPOUNDED ITEM, Pain cream : ketamine 5%, baclofen 2%,  gabapentin 5%, lidocaine 5%, menthol 1% Order faxed to [place] (Patient not taking: Reported on 11/02/2019), Disp: 1 each, Rfl: 3 .  oxyCODONE-acetaminophen (PERCOCET) 5-325 MG tablet, Take 1-2 tablets by mouth every 4 (four) hours as needed. (Patient not taking: Reported on 11/02/2019), Disp: 10 tablet, Rfl: 0 .  tamsulosin (FLOMAX) 0.4 MG CAPS capsule, Take 1 capsule (0.4 mg total) by mouth daily after breakfast. (Patient not taking: Reported on 11/02/2019), Disp: 90 capsule, Rfl: 0  No Known Allergies   Review of Systems Constitutional: -fever, -chills, -sweats, -unexpected weight change, -decreased appetite, -fatigue Allergy: -sneezing, -itching, -congestion Dermatology: -changing moles, --rash, -lumps ENT: -runny nose, -ear pain, -sore throat, -hoarseness, -sinus pain, -teeth pain, - ringing in ears, -hearing loss, -nosebleeds Cardiology: -chest pain, -palpitations, -swelling, -difficulty breathing when lying flat, -waking up short of breath Respiratory: -cough, -shortness of breath, -difficulty breathing with exercise or exertion, -wheezing, -coughing up blood Gastroenterology: +abdominal pain, -nausea, -vomiting, -diarrhea, -constipation, -blood in stool, -changes in bowel movement, -difficulty swallowing or eating Hematology: -bleeding, -bruising  Musculoskeletal: -joint aches, -muscle aches, -joint swelling, -back pain, -neck pain, -cramping, -changes in gait Ophthalmology: denies vision changes, eye redness, itching, discharge Urology: -burning with urination, -difficulty urinating, -blood in urine, -urinary frequency, -urgency, -incontinence Neurology: -headache, -weakness, -tingling, -numbness, -memory loss, -falls, -dizziness Psychology: -depressed mood, -agitation, -sleep problems Male GU: no testicular mass, pain, no lymph nodes swollen, no swelling, no rash.     Objective:  BP 128/78   Pulse 90   Temp 97.8 F (36.6 C)   Ht 6' (1.829 m)   Wt 241 lb 12.8 oz (109.7  kg)   SpO2 95%   BMI 32.79 kg/m   General appearance: alert, no distress, WD/WN, African American male Skin: large horizontal keloid across lower central chest, keloid at umbilicus,numerous skin tags circumferential around neck HEENT: normocephalic, conjunctiva/corneas normal, sclerae anicteric, PERRLA, EOMi, nares patent, no discharge or erythema, pharynx normal Oral cavity: MMM, tongue normal, teeth normal Neck: supple, no lymphadenopathy, no thyromegaly, no masses, normal ROM, no bruits Chest: non tender, normal shape and expansion Heart: RRR, normal S1, S2, no murmurs Lungs: CTA bilaterally, no wheezes, rhonchi, or rales Abdomen: +bs, soft, non tender, non distended, no masses, no hepatomegaly, no splenomegaly, no bruits Back: non tender, normal ROM, no scoliosis Musculoskeletal: upper extremities non tender, no obvious deformity, normal ROM throughout, lower extremities non tender, no obvious deformity, normal ROM throughout  Extremities: no edema, no cyanosis, no clubbing Pulses: 2+ symmetric, upper and lower extremities, normal cap refill Neurological: alert, oriented x 3, CN2-12 intact, strength normal upper extremities and lower extremities, sensation normal throughout, DTRs 2+ throughout, no cerebellar signs, gait normal Psychiatric: normal affect, behavior normal, pleasant  GU: normal male external genitalia,circumcised, nontender, small spermatocele superior to testes bilat, no masses, no hernia, no lymphadenopathy Rectal: anus normal tone, prostate WNL   Assessment and Plan :   Encounter Diagnoses  Name Primary?  . Encounter for health maintenance examination in adult Yes  . Essential hypertension, benign   . OSA (obstructive sleep apnea)   . Insulin resistance   . Impaired fasting blood sugar   . Skin tag   . Keloid   . Genital herpes simplex, unspecified site   . Screening for prostate cancer   . Obesity with serious comorbidity, unspecified classification,  unspecified obesity type   . Family history of prostate cancer in father   . Insomnia, unspecified type   . Erectile dysfunction, unspecified erectile dysfunction type   . Snoring   . Low libido   . Low testosterone in male   . Gastroesophageal reflux disease, unspecified whether esophagitis present     Physical exam - discussed and counseled on healthy lifestyle, diet, exercise, preventative care, vaccinations, sick and well care, proper use of emergency dept and after hours care, and addressed their concerns.    Health screening: See your eye doctor yearly for routine vision care. See your dentist yearly for routine dental care including hygiene visits twice yearly.  Cancer screening Colonoscopy:  Reviewed colonoscopy on file that is up to date  Discussed PSA, prostate exam, and prostate cancer screening risks/benefits.     Vaccinations: Advised yearly influenza vaccine Up to date on Td vaccine Just recently had both covid vaccines  Shingles vaccine:  I recommend you have a shingles vaccine to help prevent shingles or herpes zoster outbreak.   Please call your insurer to inquire about coverage for the Shingrix vaccine given in 2 doses.   Some insurers cover this vaccine after age 38, some cover this after age 49.  If your insurer covers this, then call to schedule appointment to have this vaccine here.   Acute issues discussed: GERD - begin omeprazole, avoid GERD triggers  Separate significant chronic issues discussed: HTN - c/t amlodipine olmesartan  C/t Crestor began last visit.  He is non fasting today.   He will return fasting for lipid panel  ED - does ok on sildenafil generic  Genital herpes - c/t valtrex prn   Lucious was seen today for annual exam.  Diagnoses and all orders for this visit:  Encounter for health maintenance examination in adult -     Comprehensive metabolic panel -     CBC with Differential/Platelet -     PSA -     Hemoglobin A1c -      Testosterone -     FSH/LH -     Prolactin -     Lipid panel; Future  Essential hypertension, benign -     Comprehensive metabolic panel -     Lipid panel; Future  OSA (obstructive sleep apnea)  Insulin resistance -     Comprehensive metabolic panel -     Hemoglobin A1c  Impaired fasting blood sugar -     Hemoglobin A1c -     Lipid panel; Future  Skin tag  Keloid  Genital herpes simplex, unspecified site  Screening  for prostate cancer -     PSA  Obesity with serious comorbidity, unspecified classification, unspecified obesity type  Family history of prostate cancer in father  Insomnia, unspecified type  Erectile dysfunction, unspecified erectile dysfunction type  Snoring  Low libido  Low testosterone in male -     Testosterone -     FSH/LH -     Prolactin  Gastroesophageal reflux disease, unspecified whether esophagitis present    Follow-up pending labs, yearly for physical

## 2019-11-03 LAB — HEMOGLOBIN A1C
Est. average glucose Bld gHb Est-mCnc: 134 mg/dL
Hgb A1c MFr Bld: 6.3 % — ABNORMAL HIGH (ref 4.8–5.6)

## 2019-11-03 LAB — CBC WITH DIFFERENTIAL/PLATELET
Basophils Absolute: 0 10*3/uL (ref 0.0–0.2)
Basos: 0 %
EOS (ABSOLUTE): 0.1 10*3/uL (ref 0.0–0.4)
Eos: 1 %
Hematocrit: 41.8 % (ref 37.5–51.0)
Hemoglobin: 13 g/dL (ref 13.0–17.7)
Immature Grans (Abs): 0 10*3/uL (ref 0.0–0.1)
Immature Granulocytes: 0 %
Lymphocytes Absolute: 2.7 10*3/uL (ref 0.7–3.1)
Lymphs: 35 %
MCH: 23.6 pg — ABNORMAL LOW (ref 26.6–33.0)
MCHC: 31.1 g/dL — ABNORMAL LOW (ref 31.5–35.7)
MCV: 76 fL — ABNORMAL LOW (ref 79–97)
Monocytes Absolute: 0.7 10*3/uL (ref 0.1–0.9)
Monocytes: 9 %
Neutrophils Absolute: 4.2 10*3/uL (ref 1.4–7.0)
Neutrophils: 55 %
Platelets: 407 10*3/uL (ref 150–450)
RBC: 5.5 x10E6/uL (ref 4.14–5.80)
RDW: 14.1 % (ref 11.6–15.4)
WBC: 7.6 10*3/uL (ref 3.4–10.8)

## 2019-11-03 LAB — COMPREHENSIVE METABOLIC PANEL
ALT: 25 IU/L (ref 0–44)
AST: 20 IU/L (ref 0–40)
Albumin/Globulin Ratio: 1.6 (ref 1.2–2.2)
Albumin: 4.4 g/dL (ref 4.0–5.0)
Alkaline Phosphatase: 84 IU/L (ref 39–117)
BUN/Creatinine Ratio: 12 (ref 9–20)
BUN: 13 mg/dL (ref 6–24)
Bilirubin Total: 0.3 mg/dL (ref 0.0–1.2)
CO2: 23 mmol/L (ref 20–29)
Calcium: 9.4 mg/dL (ref 8.7–10.2)
Chloride: 102 mmol/L (ref 96–106)
Creatinine, Ser: 1.1 mg/dL (ref 0.76–1.27)
GFR calc Af Amer: 90 mL/min/{1.73_m2} (ref >59)
GFR calc non Af Amer: 78 mL/min/{1.73_m2} (ref >59)
Globulin, Total: 2.8 g/dL (ref 1.5–4.5)
Glucose: 148 mg/dL — ABNORMAL HIGH (ref 65–99)
Potassium: 4 mmol/L (ref 3.5–5.2)
Sodium: 137 mmol/L (ref 134–144)
Total Protein: 7.2 g/dL (ref 6.0–8.5)

## 2019-11-03 LAB — PROLACTIN: Prolactin: 11.6 ng/mL (ref 4.0–15.2)

## 2019-11-03 LAB — FSH/LH
FSH: 4.1 m[IU]/mL (ref 1.5–12.4)
LH: 3 m[IU]/mL (ref 1.7–8.6)

## 2019-11-03 LAB — PSA: Prostate Specific Ag, Serum: 0.8 ng/mL (ref 0.0–4.0)

## 2019-11-03 LAB — TESTOSTERONE: Testosterone: 175 ng/dL — ABNORMAL LOW (ref 264–916)

## 2019-11-06 ENCOUNTER — Telehealth: Payer: Self-pay | Admitting: Podiatrist

## 2019-11-06 NOTE — Telephone Encounter (Signed)
The Cream that was sent over to the pharmacy was too expensive and patients insurance will not cover it. Patient was wondering if he could have something else called in.

## 2019-11-08 ENCOUNTER — Other Ambulatory Visit: Payer: Self-pay | Admitting: Medical

## 2019-11-08 MED ORDER — METFORMIN HCL 500 MG PO TABS
500.0000 mg | ORAL_TABLET | Freq: Every day | ORAL | 0 refills | Status: DC
Start: 2019-11-08 — End: 2019-12-21

## 2019-11-08 MED ORDER — TESTOSTERONE 20.25 MG/1.25GM (1.62%) TD GEL: 2.0000 | Freq: Every day | TRANSDERMAL | 2 refills | Status: DC

## 2019-11-09 ENCOUNTER — Telehealth: Payer: Self-pay

## 2019-11-09 NOTE — Telephone Encounter (Signed)
I submitted a PA for the pts. Testosterone 20.25mg /1.25gm(1.62%) Gel and it was approved from 11/09/19-11/09/22.

## 2019-11-13 ENCOUNTER — Telehealth: Payer: Self-pay | Admitting: *Deleted

## 2019-11-13 NOTE — Telephone Encounter (Signed)
Left message for pt to call to discuss alternate therapy.

## 2019-11-13 NOTE — Telephone Encounter (Signed)
Pt returned call

## 2019-11-13 NOTE — Telephone Encounter (Signed)
I spoke with pt and he states the gel had been too expensive. I told pt the voltaren gel is OTC and is less than $20.00. Pt states he will try that.

## 2019-11-13 NOTE — Telephone Encounter (Signed)
-----   Message from Bronson Ing, DPM sent at 11/12/2019  9:26 AM EDT ----- Regarding: med question Hi Val-  I couldn't figure out how to reply to the message you sent so I'm starting another.  He's already tried voltaren gel and that's the only other otc cream that might help.  He can try bengay or a menthol rub.  Or, if he is still in pain I can call him in a steroid pill pack to take orally that would likely help him.  Let me know if he's interested.  Thanks!!!  Dr. Johnette Abraham

## 2019-12-21 ENCOUNTER — Other Ambulatory Visit: Payer: Self-pay | Admitting: Medical

## 2019-12-21 NOTE — Telephone Encounter (Signed)
Pt was seen back in April for cpe and shane said to continue valtrex prn. Last refill in 2020

## 2019-12-27 ENCOUNTER — Ambulatory Visit: Payer: BC Managed Care – PPO | Admitting: Medical

## 2019-12-27 ENCOUNTER — Encounter: Payer: Self-pay | Admitting: Medical

## 2019-12-27 VITALS — BP 126/86 | HR 84 | Ht 72.0 in | Wt 238.4 lb

## 2019-12-27 DIAGNOSIS — E785 Hyperlipidemia, unspecified: Secondary | ICD-10-CM | POA: Insufficient documentation

## 2019-12-27 DIAGNOSIS — N529 Male erectile dysfunction, unspecified: Secondary | ICD-10-CM

## 2019-12-27 DIAGNOSIS — I10 Essential (primary) hypertension: Secondary | ICD-10-CM

## 2019-12-27 DIAGNOSIS — R7989 Other specified abnormal findings of blood chemistry: Secondary | ICD-10-CM

## 2019-12-27 MED ORDER — SILDENAFIL CITRATE 20 MG PO TABS
ORAL_TABLET | ORAL | 2 refills | Status: DC
Start: 2019-12-27 — End: 2020-08-28

## 2019-12-27 NOTE — Progress Notes (Signed)
Subjective:  Charles Rojas. is a 49 y.o. male who presents for Chief Complaint  Patient presents with  . Hypertension     Here for follow-up.  From last visit he started AndroGel testosterone gel 2 pumps daily.  For the reason he is using both pumps on 1 shoulder.  He does not really see any change in how he feels on this medicine  We initially looked at testosterone due to problems maintaining getting erections.  He denies any problems with desire for sex and his energy levels are good in general  He is taking sildenafil generic 3 tablets at a time for erectile dysfunction.  He last got this through urology office on site  Hypertension-compliant with medication  Hyperlipidemia last visit we started back on statin which he is taking  He is not currently using CPAP   He did get his Covid vaccine   No other c/o.  The following portions of the patient's history were reviewed and updated as appropriate: allergies, current medications, past family history, past medical history, past social history, past surgical history and problem list.  ROS Otherwise as in subjective above  Objective: BP 126/86   Pulse 84   Ht 6' (1.829 m)   Wt 238 lb 6.4 oz (108.1 kg)   SpO2 94%   BMI 32.33 kg/m   General appearance: alert, no distress, well developed, well nourished Otherwise not examined    Assessment: Encounter Diagnoses  Name Primary?  . Essential hypertension, benign Yes  . Low testosterone in male   . Erectile dysfunction, unspecified erectile dysfunction type   . Hyperlipidemia, unspecified hyperlipidemia type      Plan: Hypertension-continue current medication  Low testosterone-he is status post a month or 2 on AndroGel 2 pumps daily.  Recheck labs today.  He does not feel any different on this medication.  Consider increasing dose to 3 pumps per day  ED-seems to do okay on generic sildenafil.  He will let me know if this is too expensive  Hyperlipidemia-continue  statin, recheck labs today since he is been back on statin   [place] was seen today for hypertension.  Diagnoses and all orders for this visit:  Essential hypertension, benign -     Lipid panel  Low testosterone in male -     Testosterone  Erectile dysfunction, unspecified erectile dysfunction type -     Testosterone  Hyperlipidemia, unspecified hyperlipidemia type -     Lipid panel  Other orders -     sildenafil (REVATIO) 20 MG tablet; 1-5 tablets (20 mg to 100 mg) prior to sexual activity daily prn    Follow up: Pending labs

## 2019-12-28 ENCOUNTER — Other Ambulatory Visit: Payer: Self-pay | Admitting: Medical

## 2019-12-28 LAB — LIPID PANEL
Chol/HDL Ratio: 3.4 ratio (ref 0.0–5.0)
Cholesterol, Total: 146 mg/dL (ref 100–199)
HDL: 43 mg/dL (ref >39)
LDL Chol Calc (NIH): 85 mg/dL (ref 0–99)
Triglycerides: 99 mg/dL (ref 0–149)
VLDL Cholesterol Cal: 18 mg/dL (ref 5–40)

## 2019-12-28 LAB — TESTOSTERONE: Testosterone: 295 ng/dL (ref 264–916)

## 2019-12-28 MED ORDER — TESTOSTERONE 20.25 MG/1.25GM (1.62%) TD GEL: 3.0000 | Freq: Every day | TRANSDERMAL | 2 refills | Status: DC

## 2020-04-09 ENCOUNTER — Other Ambulatory Visit: Payer: Self-pay | Admitting: Medical

## 2020-05-01 ENCOUNTER — Other Ambulatory Visit: Payer: Self-pay | Admitting: Medical

## 2020-05-05 ENCOUNTER — Other Ambulatory Visit: Payer: Self-pay | Admitting: Medical

## 2020-05-29 ENCOUNTER — Other Ambulatory Visit: Payer: Self-pay | Admitting: Medical

## 2020-06-02 ENCOUNTER — Ambulatory Visit: Payer: BC Managed Care – PPO | Admitting: Medical

## 2020-06-02 ENCOUNTER — Other Ambulatory Visit: Payer: Self-pay

## 2020-06-02 ENCOUNTER — Encounter: Payer: Self-pay | Admitting: Medical

## 2020-06-02 VITALS — BP 138/82 | HR 93 | Ht 72.0 in | Wt 244.2 lb

## 2020-06-02 DIAGNOSIS — R109 Unspecified abdominal pain: Secondary | ICD-10-CM

## 2020-06-02 DIAGNOSIS — Z87442 Personal history of urinary calculi: Secondary | ICD-10-CM

## 2020-06-02 LAB — POCT URINALYSIS DIP (PROADVANTAGE DEVICE)
Bilirubin, UA: NEGATIVE
Glucose, UA: NEGATIVE mg/dL
Ketones, POC UA: NEGATIVE mg/dL
Leukocytes, UA: NEGATIVE
Nitrite, UA: NEGATIVE
Specific Gravity, Urine: 1.025
Urobilinogen, Ur: 0.2
pH, UA: 5.5 (ref 5.0–8.0)

## 2020-06-02 MED ORDER — OXYCODONE-ACETAMINOPHEN 5-325 MG PO TABS: 1.0000 | ORAL_TABLET | Freq: Four times a day (QID) | ORAL | 0 refills | Status: DC | PRN

## 2020-06-02 MED ORDER — POLYETHYLENE GLYCOL 3350 17 GM/SCOOP PO POWD: 17.0000 g | Freq: Every day | ORAL | 0 refills | Status: DC

## 2020-06-02 MED ORDER — TAMSULOSIN HCL 0.4 MG PO CAPS: 0.4000 mg | ORAL_CAPSULE | Freq: Every day | ORAL | 0 refills | Status: DC

## 2020-06-02 NOTE — Progress Notes (Addendum)
Subjective:    Patient ID: Charles Carwin., male    DOB: 1970-07-23, 49 y.o.   MRN: [id number] Chief Complaint  Patient presents with  . Nephrolithiasis   HPI  Patient Charles Rojas. Is a 49 year old male in today complaining of sudden onset, constant, dull pain in his right flank region that started last Friday 11/19. Took 1 percocet at onset of pain and went to urgent care Friday afternoon for evaluation. Given Ketorolac IM injection at urgent care and sent home with prescription of 800mg  ibuprofen. NSAID relieved pain over weekend, but pain came back after NSAID wore off. States pain improved on Saturday.  Sunday and he didn't need ibuprofen medication. Pain came back this morning and he took a dose of ibuprofen which relieved pain. States he has experienced intermittent nausea since pain onset on 11/19.   States pain is similar to discomfort last year when he had kidney stone, but less severe. Hx of kidney stones.   Reduced sweet tea and soda intake since kidney stone last year.   Last year he did pass the stone in July 2020.    Has had stones twice before.  The first time he passed a stone in the past was similar to 01/2019.   He had severe pain the first 2 times.  This time the pain isn't quite as bad.    No fever, no vomiting, no diarrhea.  No penile discharge.  No visible blood in urine.  No recent injury, trauma.    Urinating through strainer and so far no stone material seen over the weekend.  Review of Systems  Constitutional: Negative for chills, fatigue and fever.  Respiratory: Negative for chest tightness and shortness of breath.   Cardiovascular: Negative for chest pain, palpitations and leg swelling.  Gastrointestinal: Positive for nausea. Negative for abdominal pain, blood in stool, diarrhea and vomiting.  Genitourinary: Positive for flank pain.         Objective:   Physical Exam   BP 138/82   Pulse 93   Ht 6' (1.829 m)   Wt 244 lb 3.2 oz (110.8 kg)   SpO2  97%   BMI 33.12 kg/m   General appearence: alert, no distress, WD/WN,  Lungs: CTA bilaterally, no wheezes, rhonchi, or rales Abdomen: +bs, soft, non tender, non distended, no masses, no hepatomegaly, no splenomegaly Back: No CVA tenderness, normal range of motion without pain Pulses: 2+ symmetric, upper and lower extremities, normal cap refill      Assessment & Plan:   Encounter Diagnoses  Name Primary?  . Flank pain Yes  . History of kidney stones    We discussed the symptoms and concerns, possible differential.  Most likely renal stone.  Other differential could include urinary tract infection, mass, constipation, infection or other.  Reviewed [facility] notes from 02/2019 and 03/2019.  Stone analysis revealed Calcium Oxalate stones.  Symptoms suggest possible kidney stone issue.  He notes that this pain is similar to the prior to stones he had in recent years.  Last kidney stone was last year.  He will continue straining the urine, in the event he sees debris.  Continue good hydration.  Add Flomax today.  Refill pain medicine for as needed use.  We discussed the expectation of a stone in the past the next few days.  Advise he call back to give me an update in the next few days.  Particularly if worse pain, new symptoms, fever, frank gross hematuria or  other, then call back or recheck.  If not improving the next 3 to 4 days then recheck, consider imaging  Nurse practitioner student Mare Ferrari did the initial history taking.  I reviewed her notes in case.  We then examined the patient together and reviewed case together with patient.

## 2020-06-04 ENCOUNTER — Encounter (HOSPITAL_COMMUNITY): Payer: Self-pay | Admitting: Emergency Medicine

## 2020-06-04 ENCOUNTER — Encounter (HOSPITAL_COMMUNITY): Payer: Self-pay | Admitting: Pediatrics

## 2020-06-04 ENCOUNTER — Emergency Department (HOSPITAL_COMMUNITY)
Admission: EM | Admit: 2020-06-04 | Discharge: 2020-06-04 | Disposition: A | Discharge status: Home / Self Care | Payer: BC Managed Care – PPO | Attending: Emergency Medicine | Admitting: Emergency Medicine

## 2020-06-04 ENCOUNTER — Ambulatory Visit (HOSPITAL_COMMUNITY)
Admission: EM | Admit: 2020-06-04 | Discharge: 2020-06-04 | Disposition: A | Discharge status: Home / Self Care | Payer: BC Managed Care – PPO

## 2020-06-04 ENCOUNTER — Emergency Department (HOSPITAL_COMMUNITY): Payer: BC Managed Care – PPO

## 2020-06-04 ENCOUNTER — Other Ambulatory Visit: Payer: Self-pay

## 2020-06-04 DIAGNOSIS — Z87442 Personal history of urinary calculi: Secondary | ICD-10-CM | POA: Insufficient documentation

## 2020-06-04 DIAGNOSIS — Z79899 Other long term (current) drug therapy: Secondary | ICD-10-CM | POA: Diagnosis not present

## 2020-06-04 DIAGNOSIS — K219 Gastro-esophageal reflux disease without esophagitis: Secondary | ICD-10-CM | POA: Diagnosis not present

## 2020-06-04 DIAGNOSIS — I10 Essential (primary) hypertension: Secondary | ICD-10-CM | POA: Insufficient documentation

## 2020-06-04 DIAGNOSIS — N179 Acute kidney failure, unspecified: Secondary | ICD-10-CM

## 2020-06-04 DIAGNOSIS — R109 Unspecified abdominal pain: Secondary | ICD-10-CM | POA: Diagnosis present

## 2020-06-04 DIAGNOSIS — Z7982 Long term (current) use of aspirin: Secondary | ICD-10-CM | POA: Diagnosis not present

## 2020-06-04 DIAGNOSIS — N132 Hydronephrosis with renal and ureteral calculous obstruction: Secondary | ICD-10-CM | POA: Insufficient documentation

## 2020-06-04 DIAGNOSIS — N200 Calculus of kidney: Secondary | ICD-10-CM

## 2020-06-04 DIAGNOSIS — N201 Calculus of ureter: Secondary | ICD-10-CM

## 2020-06-04 LAB — URINALYSIS, ROUTINE W REFLEX MICROSCOPIC
Bacteria, UA: NONE SEEN
Bilirubin Urine: NEGATIVE
Glucose, UA: NEGATIVE mg/dL
Ketones, ur: NEGATIVE mg/dL
Leukocytes,Ua: NEGATIVE
Nitrite: NEGATIVE
Protein, ur: NEGATIVE mg/dL
Specific Gravity, Urine: 1.013 (ref 1.005–1.030)
pH: 5 (ref 5.0–8.0)

## 2020-06-04 LAB — BASIC METABOLIC PANEL
Anion gap: 9 (ref 5–15)
BUN: 16 mg/dL (ref 6–20)
CO2: 24 mmol/L (ref 22–32)
Calcium: 9.4 mg/dL (ref 8.9–10.3)
Chloride: 101 mmol/L (ref 98–111)
Creatinine, Ser: 1.84 mg/dL — ABNORMAL HIGH (ref 0.61–1.24)
GFR, Estimated: 44 mL/min — ABNORMAL LOW (ref >60)
Glucose, Bld: 105 mg/dL — ABNORMAL HIGH (ref 70–99)
Potassium: 4.2 mmol/L (ref 3.5–5.1)
Sodium: 134 mmol/L — ABNORMAL LOW (ref 135–145)

## 2020-06-04 LAB — CBC
HCT: 41.5 % (ref 39.0–52.0)
Hemoglobin: 12.8 g/dL — ABNORMAL LOW (ref 13.0–17.0)
MCH: 23.9 pg — ABNORMAL LOW (ref 26.0–34.0)
MCHC: 30.8 g/dL (ref 30.0–36.0)
MCV: 77.6 fL — ABNORMAL LOW (ref 80.0–100.0)
Platelets: 391 10*3/uL (ref 150–400)
RBC: 5.35 MIL/uL (ref 4.22–5.81)
RDW: 13.6 % (ref 11.5–15.5)
WBC: 9.2 10*3/uL (ref 4.0–10.5)
nRBC: 0 % (ref 0.0–0.2)

## 2020-06-04 MED ORDER — SODIUM CHLORIDE 0.9 % IV BOLUS: 1000.0000 mL | Freq: Once | INTRAVENOUS | Status: DC

## 2020-06-04 MED ORDER — HYDROMORPHONE HCL 1 MG/ML IJ SOLN
1.0000 mg | Freq: Once | INTRAMUSCULAR | Status: DC
  Filled 2020-06-04: qty 1

## 2020-06-04 MED ORDER — ONDANSETRON HCL 4 MG/2ML IJ SOLN
4.0000 mg | Freq: Once | INTRAMUSCULAR | Status: DC
  Filled 2020-06-04: qty 2

## 2020-06-04 NOTE — ED Provider Notes (Signed)
____________________________________________  Time seen: Approximately 8:35 AM  I have reviewed the triage vital signs and the nursing notes.   HISTORY  Chief Complaint Flank Pain   Historian Patient    HPI Charles Rojas. is a 49 y.o. male with a history of nephrolithiasis, presents to the urgent care with right-sided flank pain for the past 6 days.  Patient was seen and evaluated by his primary care provider on 06/02/2020 and had a urinalysis which was noncontributory for UTI.  PCP stated that patient likely had a renal stone and recommended watchful waiting and prescribed pain meds.  Patient was advised to seek care at a local emergency department if he did not have improvement in his flank pain in 2 to 3 days.  Patient states that his pain is consistent and has not changed.  He states that he has had some nausea.  Patient denies history of lithotripsy in the past.  He denies current chest pain, chest tightness or abdominal pain.   Past Medical History:  Diagnosis Date  . Erectile dysfunction   . Family history of prostate cancer   . H/O oral surgery   . Herpes    genital  . Hypertension   . Insomnia   . Obesity   . OSA (obstructive sleep apnea) 07/2017  . Renal stone 2019     Immunizations up to date:  Yes.     Past Medical History:  Diagnosis Date  . Erectile dysfunction   . Family history of prostate cancer   . H/O oral surgery   . Herpes    genital  . Hypertension   . Insomnia   . Obesity   . OSA (obstructive sleep apnea) 07/2017  . Renal stone 2019    Patient Active Problem List   Diagnosis Date Noted  . Hyperlipidemia 12/27/2019  . Low testosterone in male 11/02/2019  . Gastroesophageal reflux disease 11/02/2019  . Low libido 10/03/2019  . Insulin resistance 09/14/2018  . Impaired fasting blood sugar 09/14/2018  . Keloid 09/14/2018  . OSA (obstructive sleep apnea) 08/23/2017  . Screen for colon cancer 07/06/2017  . Essential  hypertension, benign 07/06/2017  . Snoring 07/06/2017  . Chronic pain of left knee 01/17/2017  . Screening for prostate cancer 02/12/2016  . Obesity 02/12/2016  . Family history of prostate cancer in father 02/12/2016  . Encounter for health maintenance examination in adult 02/12/2016  . Genital herpes 02/12/2016  . Insomnia 02/12/2016  . Erectile dysfunction 02/12/2016  . Skin tag 02/12/2016    Past Surgical History:  Procedure Laterality Date  . APPENDECTOMY    . MOUTH SURGERY      Prior to Admission medications   Medication Sig Start Date End Date Taking? Authorizing Provider  amlodipine-olmesartan (AZOR) 10-20 MG tablet Take 1 tablet by mouth daily. 10/03/19   Tysinger, Camelia Eng, PA-C  aspirin EC 81 MG tablet Take 1 tablet (81 mg total) by mouth daily. 10/03/19   Tysinger, Camelia Eng, PA-C  metFORMIN (GLUCOPHAGE) 500 MG tablet TAKE 1 TABLET BY MOUTH EVERY DAY WITH BREAKFAST Patient not taking: Reported on 12/27/2019 12/21/19   Tysinger, Camelia Eng, PA-C  NONFORMULARY OR COMPOUNDED ITEM Pain cream : ketamine 5%, baclofen 2%, gabapentin 5%, lidocaine 5%, menthol 1% Order faxed to [facility] 10/22/19   Bronson Ing, DPM  omeprazole (PRILOSEC) 40 MG capsule TAKE 1 CAPSULE BY MOUTH EVERY DAY 05/29/20   Tysinger, Camelia Eng, PA-C  oxyCODONE-acetaminophen (PERCOCET) 5-325 MG tablet Take 1 tablet by  mouth every 6 (six) hours as needed for severe pain. 06/02/20   Tysinger, Camelia Eng, PA-C  polyethylene glycol powder (GLYCOLAX/MIRALAX) 17 GM/SCOOP powder Take 17 g by mouth daily. 06/02/20   Tysinger, Camelia Eng, PA-C  rosuvastatin (CRESTOR) 10 MG tablet Take 1 tablet (10 mg total) by mouth at bedtime. 10/03/19 10/02/20  Tysinger, Camelia Eng, PA-C  sildenafil (REVATIO) 20 MG tablet 1-5 tablets (20 mg to 100 mg) prior to sexual activity daily prn 12/27/19   Tysinger, Camelia Eng, PA-C  tamsulosin (FLOMAX) 0.4 MG CAPS capsule Take 1 capsule (0.4 mg total) by mouth daily. 06/02/20   Tysinger, Camelia Eng, PA-C   Testosterone 20.25 MG/1.25GM (1.62%) GEL Place 3 Squirts onto the skin daily. Patient not taking: Reported on 06/02/2020 12/28/19   Tysinger, Camelia Eng, PA-C  valACYclovir (VALTREX) 1000 MG tablet TAKE 2 TABLETS BY MOUTH TWICE A DAY FOR 1 DAY FOR FLARE UP Patient not taking: Reported on 12/27/2019 12/21/19   Tysinger, Camelia Eng, PA-C    Allergies Patient has no known allergies.  Family History  Problem Relation Age of Onset  . Cancer Father        prostate, age 37  . Hyperlipidemia Father   . Diabetes Mother   . Hypertension Mother   . Osteoarthritis Mother        Knees  . Hyperlipidemia Sister   . Hypertension Sister   . Cancer Paternal Uncle        prostate  . Cancer Maternal Uncle        thyroid  . Heart disease Neg Hx   . Stroke Neg Hx     Social History Social History   Tobacco Use  . Smoking status: Never Smoker  . Smokeless tobacco: Never Used  Vaping Use  . Vaping Use: Never used  Substance Use Topics  . Alcohol use: Yes    Comment: rarely  . Drug use: No     Review of Systems  Constitutional: No fever/chills Eyes:  No discharge ENT: No upper respiratory complaints. Respiratory: no cough. No SOB/ use of accessory muscles to breath Gastrointestinal:   No nausea, no vomiting.  No diarrhea.  No constipation. Genitourinary: Patient has right-sided flank pain. Musculoskeletal: Negative for musculoskeletal pain. Skin: Negative for rash, abrasions, lacerations, ecchymosis.   ____________________________________________   PHYSICAL EXAM:  VITAL SIGNS: ED Triage Vitals  Enc Vitals Group     BP 06/04/20 0820 (!) 147/90     Pulse Rate 06/04/20 0820 87     Resp 06/04/20 0820 19     Temp 06/04/20 0820 98.9 F (37.2 C)     Temp Source 06/04/20 0820 Oral     SpO2 06/04/20 0820 98 %     Weight --      Height --      Head Circumference --      Peak Flow --      Pain Score 06/04/20 0819 6     Pain Loc --      Pain Edu? --      Excl. in [place]? --       Constitutional: Alert and oriented. Well appearing and in no acute distress. Eyes: Conjunctivae are normal. PERRL. EOMI. Head: Atraumatic. Cardiovascular: Normal rate, regular rhythm. Normal S1 and S2.  Good peripheral circulation. Respiratory: Normal respiratory effort without tachypnea or retractions. Lungs CTAB. Good air entry to the bases with no decreased or absent breath sounds Gastrointestinal: Bowel sounds x 4 quadrants. Soft and nontender to palpation. No guarding  or rigidity. No distention.  No CVA tenderness. Musculoskeletal: Full range of motion to all extremities. No obvious deformities noted Neurologic:  Normal for age. No gross focal neurologic deficits are appreciated.  Skin:  Skin is warm, dry and intact. No rash noted. Psychiatric: Mood and affect are normal for age. Speech and behavior are normal.   ____________________________________________   LABS (all labs ordered are listed, but only abnormal results are displayed)  Labs Reviewed - No data to display ____________________________________________  EKG   ____________________________________________  RADIOLOGY  No results found.  ____________________________________________    PROCEDURES  Procedure(s) performed:     Procedures     Medications - No data to display   ____________________________________________   INITIAL IMPRESSION / ASSESSMENT AND PLAN / ED COURSE  Pertinent labs & imaging results that were available during my care of the patient were reviewed by me and considered in my medical decision making (see chart for details).      Assessment and plan Flank pain 49 year old male presents to the urgent care with right-sided flank pain that has persisted for the past 6 days despite watchful waiting at home.  Patient was hypertensive at triage but vital signs were otherwise reassuring.  He had no CVA tenderness on exam.  Differential diagnosis includes nephrolithiasis,  pyelonephritis, renal colic, renal infarction...  Recommended seeking care at local emergency department for CT renal stone study and further care and management.  Patient was given shortness wait time while at urgent care.  Patient states that he feels comfortable walking across the street to emergency department.  All patient questions were answered.    ____________________________________________  FINAL CLINICAL IMPRESSION(S) / ED DIAGNOSES  Final diagnoses:  Flank pain      NEW MEDICATIONS STARTED DURING THIS VISIT:  ED Discharge Orders    None          This chart was dictated using voice recognition software/Dragon. Despite best efforts to proofread, errors can occur which can change the meaning. Any change was purely unintentional.     Lannie Fields, [place] 06/04/20 [phone]

## 2020-06-04 NOTE — Discharge Instructions (Addendum)
It was our pleasure to provide your ER care today - we hope that you feel better.  The CT scan shows a 2 mm kidney stone at the right UVJ (in the ureter, just above your bladder).   Drink plenty of water/fluids. Strain urine. Take your pain medication as need.   Follow up with urologist in 1 week if symptoms fail to improve/resolve.  From today's lab tests, your kidney function is elevated compared to prior (creatinine 1.84) - drink plenty of water/fluids, avoid taking any anti-inflammatory type of medications such as ibuprofen/motrin, or naprosyn/aleve, and follow up with your doctor for recheck in the coming week.   Return to ER if worse, new symptoms, fevers, worsening or severe pain, persistent vomiting, or other concern.

## 2020-06-04 NOTE — Discharge Instructions (Addendum)
Please go to Emergency Department at [facility] for CT renal stone study and further care/management.

## 2020-06-04 NOTE — ED Provider Notes (Addendum)
[facility] EMERGENCY DEPARTMENT Provider Note   CSN: [id number] Arrival date & time: 06/04/20  0845     History Chief Complaint  Patient presents with  . Flank Pain    Charles Rojas. is a 49 y.o. male.  Patient c/o acute onset right flank pain 4 days ago. Symptoms acute onset, moderate-sev, constant, dull, non radiating, similar to prior kidney stone pain. +nausea. No vomiting. Having normal bms. No fever or chills. No dysuria. Noted small amount hematuria on first day of symptoms.   The history is provided by the patient.  Flank Pain Pertinent negatives include no chest pain, no abdominal pain, no headaches and no shortness of breath.       Past Medical History:  Diagnosis Date  . Erectile dysfunction   . Family history of prostate cancer   . H/O oral surgery   . Herpes    genital  . Hypertension   . Insomnia   . Obesity   . OSA (obstructive sleep apnea) 07/2017  . Renal stone 2019    Patient Active Problem List   Diagnosis Date Noted  . Hyperlipidemia 12/27/2019  . Low testosterone in male 11/02/2019  . Gastroesophageal reflux disease 11/02/2019  . Low libido 10/03/2019  . Insulin resistance 09/14/2018  . Impaired fasting blood sugar 09/14/2018  . Keloid 09/14/2018  . OSA (obstructive sleep apnea) 08/23/2017  . Screen for colon cancer 07/06/2017  . Essential hypertension, benign 07/06/2017  . Snoring 07/06/2017  . Chronic pain of left knee 01/17/2017  . Screening for prostate cancer 02/12/2016  . Obesity 02/12/2016  . Family history of prostate cancer in father 02/12/2016  . Encounter for health maintenance examination in adult 02/12/2016  . Genital herpes 02/12/2016  . Insomnia 02/12/2016  . Erectile dysfunction 02/12/2016  . Skin tag 02/12/2016    Past Surgical History:  Procedure Laterality Date  . APPENDECTOMY    . MOUTH SURGERY         Family History  Problem Relation Age of Onset  . Cancer Father         prostate, age 74  . Hyperlipidemia Father   . Diabetes Mother   . Hypertension Mother   . Osteoarthritis Mother        Knees  . Hyperlipidemia Sister   . Hypertension Sister   . Cancer Paternal Uncle        prostate  . Cancer Maternal Uncle        thyroid  . Heart disease Neg Hx   . Stroke Neg Hx     Social History   Tobacco Use  . Smoking status: Never Smoker  . Smokeless tobacco: Never Used  Vaping Use  . Vaping Use: Never used  Substance Use Topics  . Alcohol use: Yes    Comment: rarely  . Drug use: No    Home Medications Prior to Admission medications   Medication Sig Start Date End Date Taking? Authorizing Provider  amlodipine-olmesartan (AZOR) 10-20 MG tablet Take 1 tablet by mouth daily. 10/03/19   Tysinger, Camelia Eng, PA-C  aspirin EC 81 MG tablet Take 1 tablet (81 mg total) by mouth daily. 10/03/19   Tysinger, Camelia Eng, PA-C  metFORMIN (GLUCOPHAGE) 500 MG tablet TAKE 1 TABLET BY MOUTH EVERY DAY WITH BREAKFAST Patient not taking: Reported on 12/27/2019 12/21/19   Tysinger, Camelia Eng, PA-C  NONFORMULARY OR COMPOUNDED ITEM Pain cream : ketamine 5%, baclofen 2%, gabapentin 5%, lidocaine 5%, menthol 1% Order faxed to [facility]  10/22/19   Bronson Ing, DPM  omeprazole (PRILOSEC) 40 MG capsule TAKE 1 CAPSULE BY MOUTH EVERY DAY 05/29/20   Tysinger, Camelia Eng, PA-C  oxyCODONE-acetaminophen (PERCOCET) 5-325 MG tablet Take 1 tablet by mouth every 6 (six) hours as needed for severe pain. 06/02/20   Tysinger, Camelia Eng, PA-C  polyethylene glycol powder (GLYCOLAX/MIRALAX) 17 GM/SCOOP powder Take 17 g by mouth daily. 06/02/20   Tysinger, Camelia Eng, PA-C  rosuvastatin (CRESTOR) 10 MG tablet Take 1 tablet (10 mg total) by mouth at bedtime. 10/03/19 10/02/20  Tysinger, Camelia Eng, PA-C  sildenafil (REVATIO) 20 MG tablet 1-5 tablets (20 mg to 100 mg) prior to sexual activity daily prn 12/27/19   Tysinger, Camelia Eng, PA-C  tamsulosin (FLOMAX) 0.4 MG CAPS capsule Take 1 capsule (0.4 mg total)  by mouth daily. 06/02/20   Tysinger, Camelia Eng, PA-C  Testosterone 20.25 MG/1.25GM (1.62%) GEL Place 3 Squirts onto the skin daily. Patient not taking: Reported on 06/02/2020 12/28/19   Tysinger, Camelia Eng, PA-C  valACYclovir (VALTREX) 1000 MG tablet TAKE 2 TABLETS BY MOUTH TWICE A DAY FOR 1 DAY FOR FLARE UP Patient not taking: Reported on 12/27/2019 12/21/19   Tysinger, Camelia Eng, PA-C    Allergies    Patient has no known allergies.  Review of Systems   Review of Systems  Constitutional: Negative for fever.  HENT: Negative for sore throat.   Eyes: Negative for redness.  Respiratory: Negative for shortness of breath.   Cardiovascular: Negative for chest pain.  Gastrointestinal: Negative for abdominal pain.  Genitourinary: Positive for flank pain. Negative for dysuria.  Musculoskeletal: Negative for neck pain.  Skin: Negative for rash.  Neurological: Negative for headaches.  Hematological: Does not bruise/bleed easily.  Psychiatric/Behavioral: Negative for confusion.    Physical Exam Updated Vital Signs BP (!) 147/93 (BP Location: Left Arm)   Pulse 88   Temp 98 F (36.7 C) (Oral)   Resp 18   Ht 1.829 m (6')   Wt 110.7 kg   SpO2 99%   BMI 33.09 kg/m   Physical Exam Vitals and nursing note reviewed.  Constitutional:      Appearance: Normal appearance. He is well-developed.  HENT:     Head: Atraumatic.     Nose: Nose normal.     Mouth/Throat:     Mouth: Mucous membranes are moist.  Eyes:     General: No scleral icterus.    Conjunctiva/sclera: Conjunctivae normal.  Neck:     Trachea: No tracheal deviation.  Cardiovascular:     Rate and Rhythm: Normal rate and regular rhythm.     Pulses: Normal pulses.     Heart sounds: Normal heart sounds. No murmur heard.  No friction rub. No gallop.   Pulmonary:     Effort: Pulmonary effort is normal. No accessory muscle usage or respiratory distress.     Breath sounds: Normal breath sounds.  Abdominal:     General: Bowel sounds are  normal. There is no distension.     Palpations: Abdomen is soft. There is no mass.     Tenderness: There is no abdominal tenderness. There is no guarding.  Genitourinary:    Comments: No cva tenderness. Musculoskeletal:        General: No swelling.     Cervical back: Neck supple.  Skin:    General: Skin is warm and dry.     Findings: No rash.  Neurological:     Mental Status: He is alert.     Comments:  Alert, speech clear.   Psychiatric:        Mood and Affect: Mood normal.     ED Results / Procedures / Treatments   Labs (all labs ordered are listed, but only abnormal results are displayed) Results for orders placed or performed during the hospital encounter of 06/04/20  Urinalysis, Routine w reflex microscopic Urine, Clean Catch  Result Value Ref Range   Color, Urine YELLOW YELLOW   APPearance CLEAR CLEAR   Specific Gravity, Urine 1.013 1.005 - 1.030   pH 5.0 5.0 - 8.0   Glucose, UA NEGATIVE NEGATIVE mg/dL   Hgb urine dipstick MODERATE (A) NEGATIVE   Bilirubin Urine NEGATIVE NEGATIVE   Ketones, ur NEGATIVE NEGATIVE mg/dL   Protein, ur NEGATIVE NEGATIVE mg/dL   Nitrite NEGATIVE NEGATIVE   Leukocytes,Ua NEGATIVE NEGATIVE   RBC / HPF 6-10 0 - 5 RBC/hpf   WBC, UA 0-5 0 - 5 WBC/hpf   Bacteria, UA NONE SEEN NONE SEEN   Mucus PRESENT   CBC  Result Value Ref Range   WBC 9.2 4.0 - 10.5 K/uL   RBC 5.35 4.22 - 5.81 MIL/uL   Hemoglobin 12.8 (L) 13.0 - 17.0 g/dL   HCT 41.5 39 - 52 %   MCV 77.6 (L) 80.0 - 100.0 fL   MCH 23.9 (L) 26.0 - 34.0 pg   MCHC 30.8 30.0 - 36.0 g/dL   RDW 13.6 11.5 - 15.5 %   Platelets 391 150 - 400 K/uL   nRBC 0.0 0.0 - 0.2 %  Basic metabolic panel  Result Value Ref Range   Sodium 134 (L) 135 - 145 mmol/L   Potassium 4.2 3.5 - 5.1 mmol/L   Chloride 101 98 - 111 mmol/L   CO2 24 22 - 32 mmol/L   Glucose, Bld 105 (H) 70 - 99 mg/dL   BUN 16 6 - 20 mg/dL   Creatinine, Ser 1.84 (H) 0.61 - 1.24 mg/dL   Calcium 9.4 8.9 - 10.3 mg/dL   GFR, Estimated 44  (L) >60 mL/min   Anion gap 9 5 - 15   CT Renal Stone Study  Result Date: 06/04/2020 CLINICAL DATA:  Right flank pain EXAM: CT ABDOMEN AND PELVIS WITHOUT CONTRAST TECHNIQUE: Multidetector CT imaging of the abdomen and pelvis was performed following the standard protocol without oral or IV contrast. COMPARISON:  January 20, 2019 FINDINGS: Lower chest: There is mild bibasilar atelectasis. Lung bases otherwise are clear. Hepatobiliary: No focal liver lesions are appreciable on this noncontrast enhanced study. Gallbladder wall is not appreciably thickened. There is no biliary duct dilatation. Pancreas: There is no pancreatic mass or inflammatory focus. Spleen: No splenic lesions are evident. Adrenals/Urinary Tract: No splenic lesions are evident. There is no appreciable renal mass on either side. There is moderate hydronephrosis on the right. There is no appreciable hydronephrosis on the left. There is no appreciable intrarenal calculus on either side. There is a 2 x 1 mm calculus at the right ureterovesical junction. No other ureteral calculi are appreciable on either side. Urinary bladder is midline with wall thickness within normal limits. Stomach/Bowel: There is no appreciable bowel wall or mesenteric thickening. There is no evident bowel obstruction. The terminal ileum appears normal. There is no evident free air or portal venous air. Vascular/Lymphatic: There is no abdominal aortic aneurysm. No vascular lesions are evident on this noncontrast enhanced study. No adenopathy is appreciable in the abdomen or pelvis. Reproductive: Prostate and seminal vesicles are normal in size and contour. Other: Appendix absent.  No periappendiceal region inflammation. No abscess or ascites evident in the abdomen pelvis. There is mild fat in the umbilicus. Musculoskeletal: There is a stable small bone island in the left sacrum. No lytic or destructive bone lesions are evident. No intramuscular lesions are evident. IMPRESSION: 1.  There is a 2 x 1 mm calculus at the right ureterovesical junction causing moderate hydronephrosis on the right. 2. No apparent bowel wall thickening or bowel obstruction. No abscess in the abdomen or pelvis. Appendix absent. No periappendiceal region inflammatory change. Electronically Signed   By: Lowella Grip III M.D.   On: 06/04/2020 10:04    EKG None  Radiology No results found.  Procedures Procedures (including critical care time)  Medications Ordered in ED Medications - No data to display  ED Course  I have reviewed the triage vital signs and the nursing notes.  Pertinent labs & imaging results that were available during my care of the patient were reviewed by me and considered in my medical decision making (see chart for details).    MDM Rules/Calculators/A&P                         Labs and imaging ordered.   Reviewed nursing notes and prior charts for additional history. Recent pcp visit for same reviewed - if symptoms persisted, pt was advised to seek imaging/ED care.   Labs reviewed/interpreted by me - cr increased from prior. Few rbc on ua c/w kidney stone.   CT reviewed/interpreted by me - right ureteral stone.  Dilaudid iv. zofran iv. Ns bolus iv.  Fluids/pain meds ordered - pt refuses, indicates his pain has resolved, and he does not want to stay for iv/iv meds. States he has pain meds at home, is taking fluids, and requests d/c.   Discussed renal fxn/?aki, po fluids, avoid nsaids, pcp f/u.   Recheck pt, comfortable, no nv, afebrile, abd soft nt - pt requests d/c to home, and currently appears stable for d/c.   Return precautions provided.        Final Clinical Impression(s) / ED Diagnoses Final diagnoses:  None    Rx / DC Orders ED Discharge Orders    None           Lajean Saver, MD 06/04/20 1052

## 2020-06-04 NOTE — ED Triage Notes (Signed)
C/o right flank pain since Friday. Concern for kidney stones that he hasn't passed, endorsed history. Stated taken percocet last night and at 4 am for pain.

## 2020-06-04 NOTE — ED Triage Notes (Signed)
Pt presents with flank pain xs 6 days. States has been seen by UC and PCP with medication but has not passed the stones. States has hx of kidney stones.

## 2020-06-04 NOTE — ED Notes (Signed)
Pt refusing pain and nausea meds. Pt denies pain at this time. MD notified

## 2020-06-24 ENCOUNTER — Encounter: Payer: Self-pay | Admitting: Medical

## 2020-06-24 ENCOUNTER — Other Ambulatory Visit: Payer: Self-pay | Admitting: Medical

## 2020-06-24 NOTE — Telephone Encounter (Signed)
Letter has been sent to patient to call and schedule physical for April of next year.

## 2020-07-07 ENCOUNTER — Other Ambulatory Visit: Payer: Self-pay | Admitting: Medical

## 2020-07-09 ENCOUNTER — Other Ambulatory Visit: Payer: Self-pay | Admitting: Medical

## 2020-08-12 ENCOUNTER — Other Ambulatory Visit: Payer: Self-pay | Admitting: Medical

## 2020-08-12 NOTE — Telephone Encounter (Signed)
I receive refill request.  Is he still taking testosterone?  If so, schedule 29mo f/u and I'll send refill

## 2020-08-18 ENCOUNTER — Other Ambulatory Visit: Payer: Self-pay | Admitting: Medical

## 2020-08-18 MED ORDER — TESTOSTERONE 20.25 MG/1.25GM (1.62%) TD GEL: 3.0000 | Freq: Every day | TRANSDERMAL | 0 refills | Status: DC

## 2020-08-18 NOTE — Telephone Encounter (Signed)
Patient has scheduled appointment. He still uses the testosterone. Please send to pharmacy.

## 2020-08-28 ENCOUNTER — Ambulatory Visit (INDEPENDENT_AMBULATORY_CARE_PROVIDER_SITE_OTHER): Payer: BC Managed Care – PPO | Admitting: Medical

## 2020-08-28 ENCOUNTER — Encounter: Payer: Self-pay | Admitting: Medical

## 2020-08-28 ENCOUNTER — Other Ambulatory Visit: Payer: Self-pay

## 2020-08-28 VITALS — BP 130/86 | HR 83 | Ht 72.0 in | Wt 248.2 lb

## 2020-08-28 DIAGNOSIS — E785 Hyperlipidemia, unspecified: Secondary | ICD-10-CM

## 2020-08-28 DIAGNOSIS — R7301 Impaired fasting glucose: Secondary | ICD-10-CM | POA: Diagnosis not present

## 2020-08-28 DIAGNOSIS — G4733 Obstructive sleep apnea (adult) (pediatric): Secondary | ICD-10-CM

## 2020-08-28 DIAGNOSIS — N529 Male erectile dysfunction, unspecified: Secondary | ICD-10-CM

## 2020-08-28 DIAGNOSIS — E8881 Metabolic syndrome: Secondary | ICD-10-CM | POA: Diagnosis not present

## 2020-08-28 DIAGNOSIS — R7989 Other specified abnormal findings of blood chemistry: Secondary | ICD-10-CM

## 2020-08-28 DIAGNOSIS — I10 Essential (primary) hypertension: Secondary | ICD-10-CM

## 2020-08-28 MED ORDER — AMLODIPINE-OLMESARTAN 10-20 MG PO TABS: 1.0000 | ORAL_TABLET | Freq: Every day | ORAL | 0 refills | Status: DC

## 2020-08-28 MED ORDER — TESTOSTERONE 20.25 MG/1.25GM (1.62%) TD GEL: 3.0000 | Freq: Every day | TRANSDERMAL | 2 refills | Status: DC

## 2020-08-28 MED ORDER — ASPIRIN EC 81 MG PO TBEC: 81.0000 mg | DELAYED_RELEASE_TABLET | Freq: Every day | ORAL | 3 refills | Status: DC

## 2020-08-28 MED ORDER — ROSUVASTATIN CALCIUM 10 MG PO TABS: 10.0000 mg | ORAL_TABLET | Freq: Every day | ORAL | 0 refills | Status: DC

## 2020-08-28 MED ORDER — TADALAFIL 20 MG PO TABS: 20.0000 mg | ORAL_TABLET | Freq: Every day | ORAL | 2 refills | Status: DC | PRN

## 2020-08-28 MED ORDER — METFORMIN HCL 500 MG PO TABS: ORAL_TABLET | ORAL | 0 refills | Status: DC

## 2020-08-28 NOTE — Progress Notes (Signed)
Cpap supplies being requested from [facility].

## 2020-08-28 NOTE — Progress Notes (Signed)
Subjective:  Charles Rojas. is a 50 y.o. male who presents for Chief Complaint  Patient presents with  . Medication Management     Here for med check.   He was having problems with kidney stones fall 2021.  Since then has cut out soda and sweet tea.  He hasn't had any more stones.  He hasn't lost any weight either though.  OSA - diagnosed 2019, never started CPAP.    Low T - he forgot to take his therapy for a few months. just started back 2 weeks ago.  Using 1.5 pump each side per last instructions  HTN -compliant with medication  Hyperlipidemia-compliant with medication without complaint  Erectile dysfunction-sildenafil even at 5 tablets or 100 mg does not seem to help. he wants to try something else  No other aggravating or relieving factors.    No other c/o.  Past Medical History:  Diagnosis Date  . Erectile dysfunction   . Family history of prostate cancer   . H/O oral surgery   . Herpes    genital  . Hypertension   . Insomnia   . Obesity   . OSA (obstructive sleep apnea) 07/2017  . Renal stone 2019     The following portions of the patient's history were reviewed and updated as appropriate: allergies, current medications, past family history, past medical history, past social history, past surgical history and problem list.  ROS Otherwise as in subjective above  Objective: BP 130/86   Pulse 83   Ht 6' (1.829 m)   Wt 248 lb 3.2 oz (112.6 kg)   SpO2 96%   BMI 33.66 kg/m   General appearance: alert, no distress, well developed, well nourished Neck: supple, no lymphadenopathy, no thyromegaly, no masses Heart: RRR, normal S1, S2, no murmurs Lungs: CTA bilaterally, no wheezes, rhonchi, or rales Pulses: 2+ radial pulses, 2+ pedal pulses, normal cap refill Ext: no edema    Assessment: Encounter Diagnoses  Name Primary?  . OSA (obstructive sleep apnea) Yes  . Low testosterone in male   . Insulin resistance   . Impaired fasting blood sugar   .  Hyperlipidemia, unspecified hyperlipidemia type   . Essential hypertension, benign      Plan: Hypertension-continue current medication  Hyperlipidemia-continue current medication  OSA-discussed the dangers of untreated sleep apnea and the fact that he has gained weight. We will refer for CPAP although there is a national back order on CPAP supplies. He is agreeable to trying CPAP again. I also recommended referral to bariatric clinic or weight management clinic but he declines. He is going to continue working on lifestyle changes  Low testosterone-just restarted therapy 2 weeks ago.  Erectile dysfunction-changed to Cialis. Discussed risk and benefits and proper use of medication.  Impaired glucose-continue lifestyle changes, congratulated him on cutting out sweet tea and soda  Ivan was seen today for medication management.  Diagnoses and all orders for this visit:  OSA (obstructive sleep apnea)  Low testosterone in male  Insulin resistance  Impaired fasting blood sugar  Hyperlipidemia, unspecified hyperlipidemia type  Essential hypertension, benign  Other orders -     tadalafil (CIALIS) 20 MG tablet; Take 1 tablet (20 mg total) by mouth daily as needed for erectile dysfunction. -     Testosterone 20.25 MG/1.25GM (1.62%) GEL; Place 3 Squirts onto the skin daily. -     rosuvastatin (CRESTOR) 10 MG tablet; Take 1 tablet (10 mg total) by mouth at bedtime. -     amlodipine-olmesartan (  AZOR) 10-20 MG tablet; Take 1 tablet by mouth daily. -     metFORMIN (GLUCOPHAGE) 500 MG tablet; TAKE 1 TABLET BY MOUTH EVERY DAY WITH BREAKFAST -     aspirin EC 81 MG tablet; Take 1 tablet (81 mg total) by mouth daily.    Follow up: spring 2022 for fasting physical

## 2020-11-12 ENCOUNTER — Encounter: Payer: Self-pay | Admitting: Medical

## 2020-11-12 ENCOUNTER — Ambulatory Visit: Payer: BC Managed Care – PPO | Admitting: Medical

## 2020-11-12 ENCOUNTER — Other Ambulatory Visit: Payer: Self-pay

## 2020-11-12 VITALS — BP 130/88 | HR 60 | Resp 16 | Ht 71.5 in | Wt 213.6 lb

## 2020-11-12 DIAGNOSIS — Z0000 Encounter for general adult medical examination without abnormal findings: Secondary | ICD-10-CM | POA: Diagnosis not present

## 2020-11-12 DIAGNOSIS — E88819 Insulin resistance, unspecified: Secondary | ICD-10-CM

## 2020-11-12 DIAGNOSIS — L910 Hypertrophic scar: Secondary | ICD-10-CM

## 2020-11-12 DIAGNOSIS — Z8042 Family history of malignant neoplasm of prostate: Secondary | ICD-10-CM

## 2020-11-12 DIAGNOSIS — G4733 Obstructive sleep apnea (adult) (pediatric): Secondary | ICD-10-CM

## 2020-11-12 DIAGNOSIS — E785 Hyperlipidemia, unspecified: Secondary | ICD-10-CM

## 2020-11-12 DIAGNOSIS — Z125 Encounter for screening for malignant neoplasm of prostate: Secondary | ICD-10-CM

## 2020-11-12 DIAGNOSIS — N529 Male erectile dysfunction, unspecified: Secondary | ICD-10-CM | POA: Diagnosis not present

## 2020-11-12 DIAGNOSIS — G4700 Insomnia, unspecified: Secondary | ICD-10-CM

## 2020-11-12 DIAGNOSIS — I10 Essential (primary) hypertension: Secondary | ICD-10-CM

## 2020-11-12 DIAGNOSIS — K219 Gastro-esophageal reflux disease without esophagitis: Secondary | ICD-10-CM

## 2020-11-12 DIAGNOSIS — E8881 Metabolic syndrome: Secondary | ICD-10-CM

## 2020-11-12 DIAGNOSIS — R7989 Other specified abnormal findings of blood chemistry: Secondary | ICD-10-CM

## 2020-11-12 DIAGNOSIS — A6000 Herpesviral infection of urogenital system, unspecified: Secondary | ICD-10-CM

## 2020-11-12 DIAGNOSIS — R7301 Impaired fasting glucose: Secondary | ICD-10-CM

## 2020-11-12 LAB — POCT URINALYSIS DIP (PROADVANTAGE DEVICE)
Bilirubin, UA: NEGATIVE
Glucose, UA: NEGATIVE mg/dL
Leukocytes, UA: NEGATIVE
Nitrite, UA: NEGATIVE
Protein Ur, POC: NEGATIVE mg/dL
Specific Gravity, Urine: 1.03
Urobilinogen, Ur: NEGATIVE
pH, UA: 6 (ref 5.0–8.0)

## 2020-11-12 NOTE — Patient Instructions (Signed)
This visit was a preventative care visit, also known as wellness visit or routine physical.   Topics typically include healthy lifestyle, diet, exercise, preventative care, vaccinations, sick and well care, proper use of emergency dept and after hours care, as well as other concerns.     Recommendations: Continue to return yearly for your annual wellness and preventative care visits.  This gives [place] a chance to discuss healthy lifestyle, exercise, vaccinations, review your chart record, and perform screenings where appropriate.  I recommend you see your eye doctor yearly for routine vision care.  I recommend you see your dentist yearly for routine dental care including hygiene visits twice yearly.   Vaccination recommendations were reviewed Immunization History  Administered Date(s) Administered  . Moderna Sars-Covid-2 Vaccination 06/15/2020  . PFIZER(Purple Top)SARS-COV-2 Vaccination 09/07/2019, 09/28/2019  . Td 04/14/1995  . Tdap 12/19/2013    Shingles vaccine:  I recommend you have a shingles vaccine to help prevent shingles or herpes zoster outbreak.   Please call your insurer to inquire about coverage for the Shingrix vaccine given in 2 doses.   Some insurers cover this vaccine after age 2, some cover this after age 63.  If your insurer covers this, then call to schedule appointment to have this vaccine here.    Screening for cancer: Colon cancer screening: I reviewed your colonoscopy on file that is up to date from 2020  We discussed PSA, prostate exam, and prostate cancer screening risks/benefits.     Skin cancer screening: Check your skin regularly for new changes, growing lesions, or other lesions of concern Come in for evaluation if you have skin lesions of concern.  Lung cancer screening: If you have a greater than 20 pack year history of tobacco use, then you may qualify for lung cancer screening with a chest CT scan.   Please call your insurance company to inquire  about coverage for this test.  We currently don't have screenings for other cancers besides breast, cervical, colon, and lung cancers.  If you have a strong family history of cancer or have other cancer screening concerns, please let me know.    Bone health: Get at least 150 minutes of aerobic exercise weekly Get weight bearing exercise at least once weekly Bone density test:   A bone density test is an imaging test that uses a type of X-ray to measure the amount of calcium and other minerals in your bones.  The test may be used to diagnose or screen you for a condition that causes weak or thin bones (osteoporosis), predict your risk for a broken bone (fracture), or determine how well your osteoporosis treatment is working. The bone density test is recommended for females 48 and older, or females or males <92 if certain risk factors such as thyroid disease, long term use of steroids such as for asthma or rheumatological issues, vitamin D deficiency, estrogen deficiency, family history of osteoporosis, self or family history of fragility fracture in first degree relative.    Heart health: Get at least 150 minutes of aerobic exercise weekly Limit alcohol It is important to maintain a healthy blood pressure and healthy cholesterol numbers  Heart disease screening: Screening for heart disease includes screening for blood pressure, fasting lipids, glucose/diabetes screening, BMI height to weight ratio, reviewed of smoking status, physical activity, and diet.    Goals include blood pressure 120/80 or less, maintaining a healthy lipid/cholesterol profile, preventing diabetes or keeping diabetes numbers under good control, not smoking or using tobacco products, exercising  most days per week or at least 150 minutes per week of exercise, and eating healthy variety of fruits and vegetables, healthy oils, and avoiding unhealthy food choices like fried food, fast food, high sugar and high cholesterol  foods.    Other tests may possibly include EKG test, CT coronary calcium score, echocardiogram, exercise treadmill stress test.    Medical care options: I recommend you continue to seek care here first for routine care.  We try really hard to have available appointments Monday through Friday daytime hours for sick visits, acute visits, and physicals.  Urgent care should be used for after hours and weekends for significant issues that cannot wait till the next day.  The emergency department should be used for significant potentially life-threatening emergencies.  The emergency department is expensive, can often have long wait times for less significant concerns, so try to utilize primary care, urgent care, or telemedicine when possible to avoid unnecessary trips to the emergency department.  Virtual visits and telemedicine have been introduced since the pandemic started in 2020, and can be convenient ways to receive medical care.  We offer virtual appointments as well to assist you in a variety of options to seek medical care.    Separate significant issues discussed: I congratulated him on his weight loss and lifestyle changes from last year.  Encouraged him to continue what he is doing  Hypertension-continue current medication, labs today  Hyperlipidemia- continue statin, labs today  Insulin resistance- continue healthy lifestyle  GERD- resolved with weight loss and lifestyle changes, not on PPI in the last year  Impaired glucose- labs today  Insomnia- no recent concerns  Low testosterone- labs surveillance today, he continues routine therapy

## 2020-11-12 NOTE — Addendum Note (Signed)
Addended byLinus Salmons, Reagann Dolce D on: 11/12/2020 01:25 PM   Modules accepted: Orders

## 2020-11-12 NOTE — Progress Notes (Signed)
Subjective:   HPI  Charles Rojas. is a 50 y.o. male who presents for Chief Complaint  Patient presents with  . Annual Exam    Fasting annual exam, no concerns. Patient sees eye doctor regularly for eye exams.     Patient Care Team: Rashana Andrew, Leward Quan as PCP - General (Family Medicine) Sees dentist Sees eye doctor Dr. Juanita Craver, GI  Concerns: Last year after physical he and wife went on a fitness challenge and he has changed eating habits, started exercising daily and host had major improvements in weight and fitness  Exercising daily including walking, running, calisthenics.  Eating habits - lots of grilled items, lots of [place], grilled chicken, lots of vegetables, low sugar intake, very limited pasta.    Low T, low libido - compliant with testosterone therapy at 1.5 each shoulder or 3 pumps total daily   Hypertension-compliant with medication without complaint  Hyperlipidemia-compliant with medication without complaint  History of acid reflux-hasn't been having to use omeprazole  Impaired glucose - hasn't taken since last year.  Reviewed their medical, surgical, family, social, medication, and allergy history and updated chart as appropriate.  Past Medical History:  Diagnosis Date  . Erectile dysfunction   . Family history of prostate cancer   . GERD (gastroesophageal reflux disease)   . H/O oral surgery   . Herpes    genital  . Hyperlipidemia   . Hypertension   . Impaired fasting blood sugar   . Insomnia   . Insulin resistance   . Low testosterone in male   . Obesity   . OSA (obstructive sleep apnea) 07/2017  . Renal stone 2019    Past Surgical History:  Procedure Laterality Date  . APPENDECTOMY    . COLONOSCOPY  2020   multiple polyps; Dr. Juanita Craver  . MOUTH SURGERY      Family History  Problem Relation Age of Onset  . Cancer Father        prostate, age 67  . Hyperlipidemia Father   . Diabetes Mother   . Hypertension Mother   .  Osteoarthritis Mother        Knees  . Hyperlipidemia Sister   . Hypertension Sister   . Cancer Paternal Uncle        prostate  . Cancer Maternal Uncle        thyroid  . Heart disease Neg Hx   . Stroke Neg Hx      Current Outpatient Medications:  .  amlodipine-olmesartan (AZOR) 10-20 MG tablet, Take 1 tablet by mouth daily., Disp: 90 tablet, Rfl: 0 .  aspirin EC 81 MG tablet, Take 1 tablet (81 mg total) by mouth daily., Disp: 90 tablet, Rfl: 3 .  rosuvastatin (CRESTOR) 10 MG tablet, Take 1 tablet (10 mg total) by mouth at bedtime., Disp: 90 tablet, Rfl: 0 .  tadalafil (CIALIS) 20 MG tablet, Take 1 tablet (20 mg total) by mouth daily as needed for erectile dysfunction., Disp: 10 tablet, Rfl: 2 .  Testosterone 20.25 MG/1.25GM (1.62%) GEL, Place 3 Squirts onto the skin daily., Disp: 2.5 g, Rfl: 2 .  metFORMIN (GLUCOPHAGE) 500 MG tablet, TAKE 1 TABLET BY MOUTH EVERY DAY WITH BREAKFAST (Patient not taking: Reported on 11/12/2020), Disp: 90 tablet, Rfl: 0 .  NONFORMULARY OR COMPOUNDED ITEM, Pain cream : ketamine 5%, baclofen 2%, gabapentin 5%, lidocaine 5%, menthol 1% Order faxed to [place] (Patient not taking: Reported on 11/12/2020), Disp: 1 each, Rfl: 3 .  omeprazole (PRILOSEC)  40 MG capsule, TAKE 1 CAPSULE BY MOUTH EVERY DAY (Patient not taking: Reported on 11/12/2020), Disp: 90 capsule, Rfl: 0 .  valACYclovir (VALTREX) 1000 MG tablet, TAKE 2 TABLETS BY MOUTH TWICE A DAY FOR 1 DAY FOR FLARE UP (Patient not taking: Reported on 11/12/2020), Disp: 20 tablet, Rfl: 0  No Known Allergies   Review of Systems Constitutional: -fever, -chills, -sweats, -unexpected weight change, -decreased appetite, -fatigue Allergy: -sneezing, -itching, -congestion Dermatology: -changing moles, --rash, -lumps ENT: -runny nose, -ear pain, -sore throat, -hoarseness, -sinus pain, -teeth pain, - ringing in ears, -hearing loss, -nosebleeds Cardiology: -chest pain, -palpitations, -swelling, -difficulty breathing  when lying flat, -waking up short of breath Respiratory: -cough, -shortness of breath, -difficulty breathing with exercise or exertion, -wheezing, -coughing up blood Gastroenterology: -abdominal pain, -nausea, -vomiting, -diarrhea, -constipation, -blood in stool, -changes in bowel movement, -difficulty swallowing or eating Hematology: -bleeding, -bruising  Musculoskeletal: -joint aches, -muscle aches, -joint swelling, -back pain, -neck pain, -cramping, -changes in gait Ophthalmology: denies vision changes, eye redness, itching, discharge Urology: -burning with urination, -difficulty urinating, -blood in urine, -urinary frequency, -urgency, -incontinence Neurology: -headache, -weakness, -tingling, -numbness, -memory loss, -falls, -dizziness Psychology: -depressed mood, -agitation, -sleep problems Male GU: no testicular mass, pain, no lymph nodes swollen, no swelling, no rash.   Depression screen [facility] 2/9 11/12/2020 08/28/2020 11/02/2019 09/14/2018 07/06/2017  Decreased Interest 0 0 0 0 0  Down, Depressed, Hopeless 0 0 0 0 0  PHQ - 2 Score 0 0 0 0 0  Altered sleeping - - 0 - -  Tired, decreased energy - - 0 - -  Change in appetite - - 0 - -  Feeling bad or failure about yourself  - - 0 - -  Trouble concentrating - - 0 - -  Moving slowly or fidgety/restless - - 0 - -  Suicidal thoughts - - 0 - -  PHQ-9 Score - - 0 - -  Difficult doing work/chores - - Not difficult at all - -        Objective:  BP 130/88   Pulse 60   Resp 16   Ht 5' 11.5" (1.816 m)   Wt 213 lb 9.6 oz (96.9 kg)   BMI 29.38 kg/m   Wt Readings from Last 3 Encounters:  11/12/20 213 lb 9.6 oz (96.9 kg)  08/28/20 248 lb 3.2 oz (112.6 kg)  06/04/20 244 lb (110.7 kg)   BP Readings from Last 3 Encounters:  11/12/20 130/88  08/28/20 130/86  06/04/20 (!) 141/89   General appearance: alert, no distress, WD/WN, African American male Skin: Large keloid horizontally across the head chest, keloid of the umbilicus, numerous skin  tags of the anterior and left and right anterior neck HEENT: normocephalic, conjunctiva/corneas normal, sclerae anicteric, PERRLA, EOMi, nares patent, no discharge or erythema, pharynx normal Oral cavity: MMM, tongue normal, teeth normal Neck: supple, no lymphadenopathy, no thyromegaly, no masses, normal ROM, no bruits Chest: non tender, normal shape and expansion Heart: RRR, normal S1, S2, no murmurs Lungs: CTA bilaterally, no wheezes, rhonchi, or rales Abdomen: +bs, soft, non tender, non distended, no masses, no hepatomegaly, no splenomegaly, no bruits Back: non tender, normal ROM, no scoliosis Musculoskeletal: upper extremities non tender, no obvious deformity, normal ROM throughout, lower extremities non tender, no obvious deformity, normal ROM throughout Extremities: no edema, no cyanosis, no clubbing Pulses: 2+ symmetric, upper and lower extremities, normal cap refill Neurological: alert, oriented x 3, CN2-12 intact, strength normal upper extremities and lower extremities, sensation normal throughout, DTRs  2+ throughout, no cerebellar signs, gait normal Psychiatric: normal affect, behavior normal, pleasant  GU: normal male external genitalia,circumcised, nontender, right superior spermatocele superior to testicle, otherwise no masses, no hernia, no lymphadenopathy Rectal: anus normal tone, prostate mildly enlarged   Assessment and Plan :   Encounter Diagnoses  Name Primary?  . Encounter for health maintenance examination in adult Yes  . Erectile dysfunction, unspecified erectile dysfunction type   . Essential hypertension, benign   . Family history of prostate cancer in father   . Gastroesophageal reflux disease, unspecified whether esophagitis present   . Hyperlipidemia, unspecified hyperlipidemia type   . Impaired fasting blood sugar   . Screening for prostate cancer   . OSA (obstructive sleep apnea)   . Low testosterone in male   . Insomnia, unspecified type   . Insulin  resistance   . Keloid   . Genital herpes simplex, unspecified site     This visit was a preventative care visit, also known as wellness visit or routine physical.   Topics typically include healthy lifestyle, diet, exercise, preventative care, vaccinations, sick and well care, proper use of emergency dept and after hours care, as well as other concerns.     Recommendations: Continue to return yearly for your annual wellness and preventative care visits.  This gives [place] a chance to discuss healthy lifestyle, exercise, vaccinations, review your chart record, and perform screenings where appropriate.  I recommend you see your eye doctor yearly for routine vision care.  I recommend you see your dentist yearly for routine dental care including hygiene visits twice yearly.   Vaccination recommendations were reviewed Immunization History  Administered Date(s) Administered  . Moderna Sars-Covid-2 Vaccination 06/15/2020  . PFIZER(Purple Top)SARS-COV-2 Vaccination 09/07/2019, 09/28/2019  . Td 04/14/1995  . Tdap 12/19/2013    Shingles vaccine:  I recommend you have a shingles vaccine to help prevent shingles or herpes zoster outbreak.   Please call your insurer to inquire about coverage for the Shingrix vaccine given in 2 doses.   Some insurers cover this vaccine after age 61, some cover this after age 23.  If your insurer covers this, then call to schedule appointment to have this vaccine here.    Screening for cancer: Colon cancer screening: I reviewed your colonoscopy on file that is up to date from 2020  We discussed PSA, prostate exam, and prostate cancer screening risks/benefits.     Skin cancer screening: Check your skin regularly for new changes, growing lesions, or other lesions of concern Come in for evaluation if you have skin lesions of concern.  Lung cancer screening: If you have a greater than 20 pack year history of tobacco use, then you may qualify for lung cancer  screening with a chest CT scan.   Please call your insurance company to inquire about coverage for this test.  We currently don't have screenings for other cancers besides breast, cervical, colon, and lung cancers.  If you have a strong family history of cancer or have other cancer screening concerns, please let me know.    Bone health: Get at least 150 minutes of aerobic exercise weekly Get weight bearing exercise at least once weekly Bone density test:   A bone density test is an imaging test that uses a type of X-ray to measure the amount of calcium and other minerals in your bones.  The test may be used to diagnose or screen you for a condition that causes weak or thin bones (osteoporosis), predict your risk  for a broken bone (fracture), or determine how well your osteoporosis treatment is working. The bone density test is recommended for females 16 and older, or females or males [id number] if certain risk factors such as thyroid disease, long term use of steroids such as for asthma or rheumatological issues, vitamin D deficiency, estrogen deficiency, family history of osteoporosis, self or family history of fragility fracture in first degree relative.    Heart health: Get at least 150 minutes of aerobic exercise weekly Limit alcohol It is important to maintain a healthy blood pressure and healthy cholesterol numbers  Heart disease screening: Screening for heart disease includes screening for blood pressure, fasting lipids, glucose/diabetes screening, BMI height to weight ratio, reviewed of smoking status, physical activity, and diet.    Goals include blood pressure 120/80 or less, maintaining a healthy lipid/cholesterol profile, preventing diabetes or keeping diabetes numbers under good control, not smoking or using tobacco products, exercising most days per week or at least 150 minutes per week of exercise, and eating healthy variety of fruits and vegetables, healthy oils, and avoiding  unhealthy food choices like fried food, fast food, high sugar and high cholesterol foods.    Other tests may possibly include EKG test, CT coronary calcium score, echocardiogram, exercise treadmill stress test.    Medical care options: I recommend you continue to seek care here first for routine care.  We try really hard to have available appointments Monday through Friday daytime hours for sick visits, acute visits, and physicals.  Urgent care should be used for after hours and weekends for significant issues that cannot wait till the next day.  The emergency department should be used for significant potentially life-threatening emergencies.  The emergency department is expensive, can often have long wait times for less significant concerns, so try to utilize primary care, urgent care, or telemedicine when possible to avoid unnecessary trips to the emergency department.  Virtual visits and telemedicine have been introduced since the pandemic started in 2020, and can be convenient ways to receive medical care.  We offer virtual appointments as well to assist you in a variety of options to seek medical care.    Separate significant issues discussed: I congratulated him on his weight loss and lifestyle changes from last year.  Encouraged him to continue what he is doing   Hypertension-continue current medication, labs today  Hyperlipidemia- continue statin, labs today  Insulin resistance- continue healthy lifestyle  GERD- resolved with weight loss and lifestyle changes, not on PPI in the last year  Impaired glucose- labs today  Insomnia- no recent concerns  Low testosterone- labs surveillance today, he continues routine therapy  OSA - no concerns since losing weight  [place] was seen today for annual exam.  Diagnoses and all orders for this visit:  Encounter for health maintenance examination in adult -     POCT Urinalysis DIP (Proadvantage Device) -     Comprehensive metabolic  panel -     CBC with Differential/Platelet -     PSA -     Hemoglobin A1c -     Lipid panel -     Testosterone  Erectile dysfunction, unspecified erectile dysfunction type  Essential hypertension, benign -     Comprehensive metabolic panel  Family history of prostate cancer in father -     PSA  Gastroesophageal reflux disease, unspecified whether esophagitis present  Hyperlipidemia, unspecified hyperlipidemia type  Impaired fasting blood sugar  Screening for prostate cancer -     PSA  OSA (obstructive sleep apnea)  Low testosterone in male -     Comprehensive metabolic panel -     PSA -     Testosterone  Insomnia, unspecified type  Insulin resistance -     Hemoglobin A1c  Keloid  Genital herpes simplex, unspecified site    Follow-up pending labs, yearly for physical

## 2020-11-13 ENCOUNTER — Other Ambulatory Visit: Payer: Self-pay | Admitting: Medical

## 2020-11-13 LAB — CBC WITH DIFFERENTIAL/PLATELET
Basophils Absolute: 0 10*3/uL (ref 0.0–0.2)
Basos: 0 %
EOS (ABSOLUTE): 0 10*3/uL (ref 0.0–0.4)
Eos: 0 %
Hematocrit: 40.7 % (ref 37.5–51.0)
Hemoglobin: 12.9 g/dL — ABNORMAL LOW (ref 13.0–17.7)
Immature Grans (Abs): 0 10*3/uL (ref 0.0–0.1)
Immature Granulocytes: 0 %
Lymphocytes Absolute: 2.9 10*3/uL (ref 0.7–3.1)
Lymphs: 41 %
MCH: 23.5 pg — ABNORMAL LOW (ref 26.6–33.0)
MCHC: 31.7 g/dL (ref 31.5–35.7)
MCV: 74 fL — ABNORMAL LOW (ref 79–97)
Monocytes Absolute: 0.9 10*3/uL (ref 0.1–0.9)
Monocytes: 12 %
Neutrophils Absolute: 3.4 10*3/uL (ref 1.4–7.0)
Neutrophils: 47 %
Platelets: 468 10*3/uL — ABNORMAL HIGH (ref 150–450)
RBC: 5.49 x10E6/uL (ref 4.14–5.80)
RDW: 13.2 % (ref 11.6–15.4)
WBC: 7.2 10*3/uL (ref 3.4–10.8)

## 2020-11-13 LAB — LIPID PANEL
Chol/HDL Ratio: 3 ratio (ref 0.0–5.0)
Cholesterol, Total: 136 mg/dL (ref 100–199)
HDL: 46 mg/dL (ref >39)
LDL Chol Calc (NIH): 79 mg/dL (ref 0–99)
Triglycerides: 48 mg/dL (ref 0–149)
VLDL Cholesterol Cal: 11 mg/dL (ref 5–40)

## 2020-11-13 LAB — COMPREHENSIVE METABOLIC PANEL
ALT: 12 IU/L (ref 0–44)
AST: 10 IU/L (ref 0–40)
Albumin/Globulin Ratio: 1.4 (ref 1.2–2.2)
Albumin: 4.4 g/dL (ref 3.8–4.9)
Alkaline Phosphatase: 75 IU/L (ref 44–121)
BUN/Creatinine Ratio: 15 (ref 9–20)
BUN: 15 mg/dL (ref 6–24)
Bilirubin Total: 0.4 mg/dL (ref 0.0–1.2)
CO2: 20 mmol/L (ref 20–29)
Calcium: 9.5 mg/dL (ref 8.7–10.2)
Chloride: 101 mmol/L (ref 96–106)
Creatinine, Ser: 0.97 mg/dL (ref 0.76–1.27)
Globulin, Total: 3.1 g/dL (ref 1.5–4.5)
Glucose: 75 mg/dL (ref 65–99)
Potassium: 4.4 mmol/L (ref 3.5–5.2)
Sodium: 137 mmol/L (ref 134–144)
Total Protein: 7.5 g/dL (ref 6.0–8.5)
eGFR: 95 mL/min/{1.73_m2} (ref >59)

## 2020-11-13 LAB — HEMOGLOBIN A1C
Est. average glucose Bld gHb Est-mCnc: 123 mg/dL
Hgb A1c MFr Bld: 5.9 % — ABNORMAL HIGH (ref 4.8–5.6)

## 2020-11-13 LAB — PSA: Prostate Specific Ag, Serum: 1 ng/mL (ref 0.0–4.0)

## 2020-11-13 LAB — URINALYSIS, MICROSCOPIC ONLY
Bacteria, UA: NONE SEEN
Casts: NONE SEEN /lpf
Epithelial Cells (non renal): NONE SEEN /hpf (ref 0–10)
WBC, UA: NONE SEEN /hpf (ref 0–5)

## 2020-11-13 LAB — TESTOSTERONE: Testosterone: 312 ng/dL (ref 264–916)

## 2020-11-13 MED ORDER — AMLODIPINE-OLMESARTAN 10-20 MG PO TABS: 1.0000 | ORAL_TABLET | Freq: Every day | ORAL | 3 refills | Status: DC

## 2020-11-13 MED ORDER — ROSUVASTATIN CALCIUM 10 MG PO TABS: 10.0000 mg | ORAL_TABLET | Freq: Every day | ORAL | 3 refills | Status: DC

## 2020-11-13 MED ORDER — TESTOSTERONE 20.25 MG/1.25GM (1.62%) TD GEL: 3.0000 | Freq: Every day | TRANSDERMAL | 5 refills | Status: DC

## 2020-11-13 MED ORDER — TADALAFIL 20 MG PO TABS: 20.0000 mg | ORAL_TABLET | Freq: Every day | ORAL | 5 refills | Status: DC | PRN

## 2020-11-18 ENCOUNTER — Encounter: Payer: Self-pay | Admitting: Family Medicine

## 2020-11-18 ENCOUNTER — Other Ambulatory Visit: Payer: Self-pay

## 2020-11-18 ENCOUNTER — Telehealth: Payer: BC Managed Care – PPO | Admitting: Family Medicine

## 2020-11-18 VITALS — Temp 97.7°F | Ht 72.0 in | Wt 213.0 lb

## 2020-11-18 DIAGNOSIS — Z1211 Encounter for screening for malignant neoplasm of colon: Secondary | ICD-10-CM

## 2020-11-18 DIAGNOSIS — U071 COVID-19: Secondary | ICD-10-CM | POA: Diagnosis not present

## 2020-11-18 DIAGNOSIS — D6489 Other specified anemias: Secondary | ICD-10-CM

## 2020-11-18 NOTE — Progress Notes (Signed)
   Subjective:    Patient ID: Charles Carwin., male    DOB: 1971-03-02, 50 y.o.   MRN: [id number]  HPI Documentation for virtual audio and video telecommunications through [facility] encounter:  The patient was located at home. 2 patient identifiers used.  The provider was located in the office. The patient did consent to this visit and is aware of possible charges through their insurance for this visit.  The other persons participating in this telemedicine service were none. Time spent on call was 5 minutes and in review of previous records >20 minutes total for counseling and coordination of care.  This virtual service is not related to other E/M service within previous 7 days. He states that on Sunday he developed chills with headache, chest congestion.  Yesterday he tested positive for COVID.  Apparently this morning he had a syncopal episode where he became hot and sweaty and then found himself on the floor.  He feels fine now.  His blood pressure was low.  He has been using ibuprofen as needed.  Review of Systems     Objective:   Physical Exam Alert and in no distress but does not appear toxic      Assessment & Plan:  COVID Explained that I thought he had a syncopal episode and we need to hold off on taking his blood pressure medication, keep him well-hydrated and use ibuprofen 800 mg 3 times per day regularly.  Discussed worsening of symptoms in regard to worsening fever, shortness of breath and coughing.  Recommend 5 days of isolation and then a mask for another 5 days.  Call if further questions.

## 2021-01-21 IMAGING — CT CT RENAL STONE PROTOCOL
2 of 4 series · 15 of 46 positions shown, 17 images · non-contrast
Comparison: January 20, 2019

CLINICAL DATA: Right flank pain

EXAM:
CT ABDOMEN AND PELVIS WITHOUT CONTRAST
TECHNIQUE: Multidetector CT imaging of the abdomen and pelvis was performed
following the standard protocol without oral or IV contrast.

[Series 3: renal stone 5.0 · axial · 0.80mm/px · z∈[+866,+1286]mm · 12 of 100 slices shown, 14 images]
[im 8/100  soft-tissue]
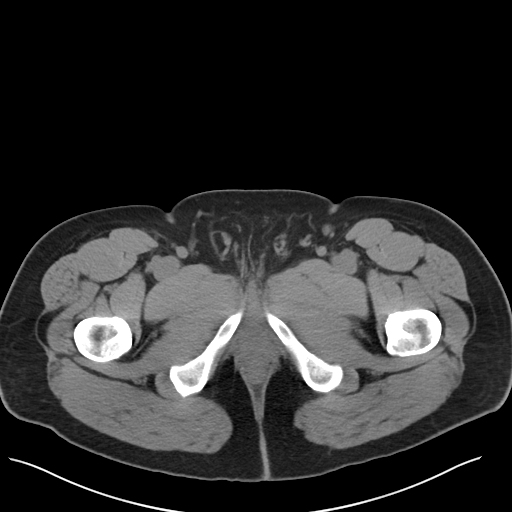
[im 8/100  bone]
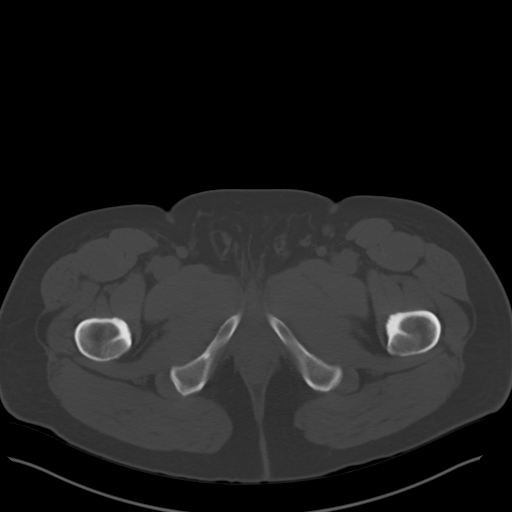
[im 16/100  soft-tissue]
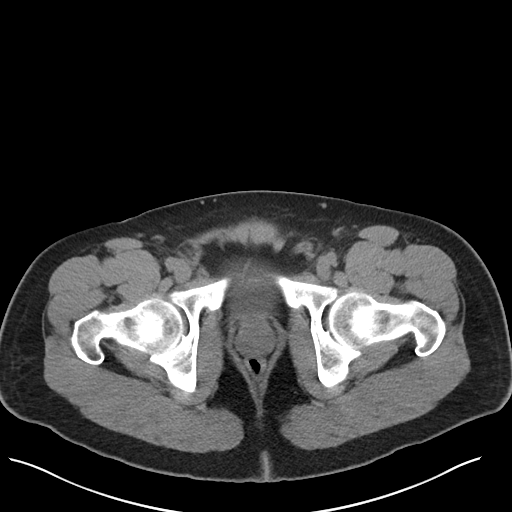
[im 23/100  soft-tissue]
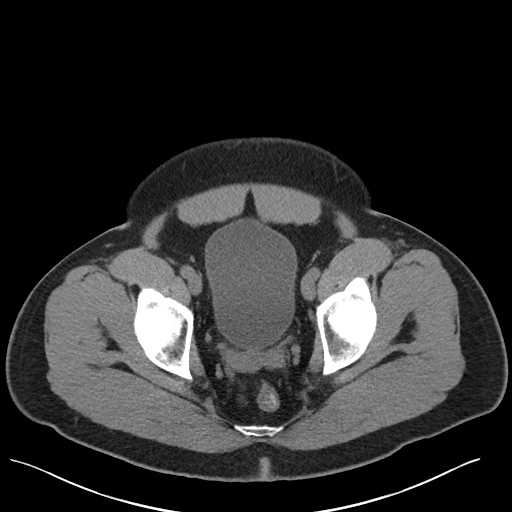
[im 31/100  soft-tissue]
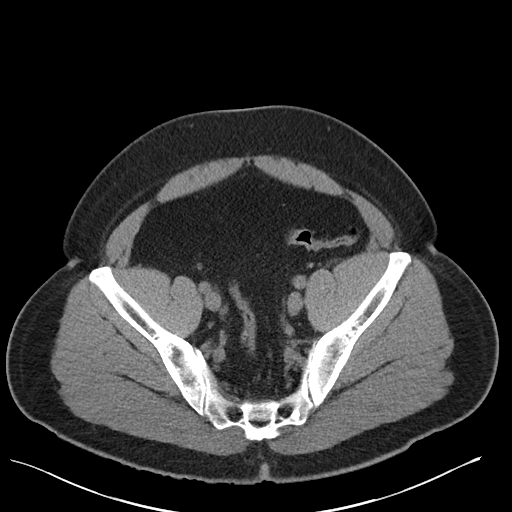
[im 39/100  soft-tissue]
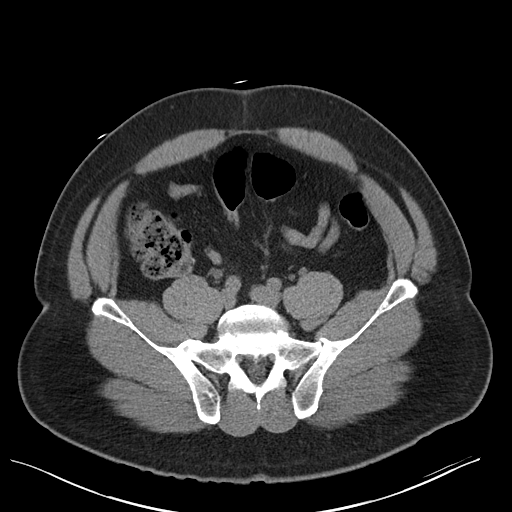
[im 46/100  soft-tissue]
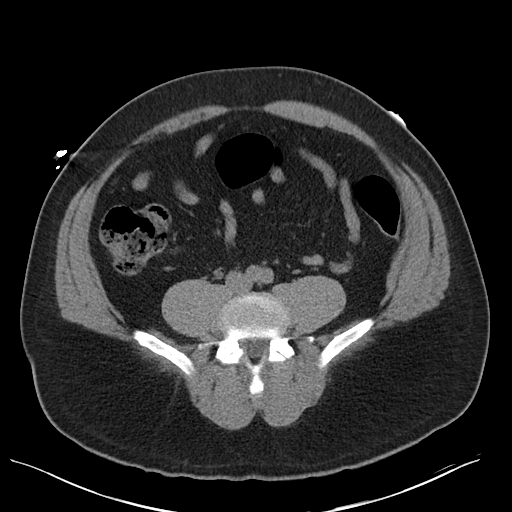
[im 54/100  soft-tissue]
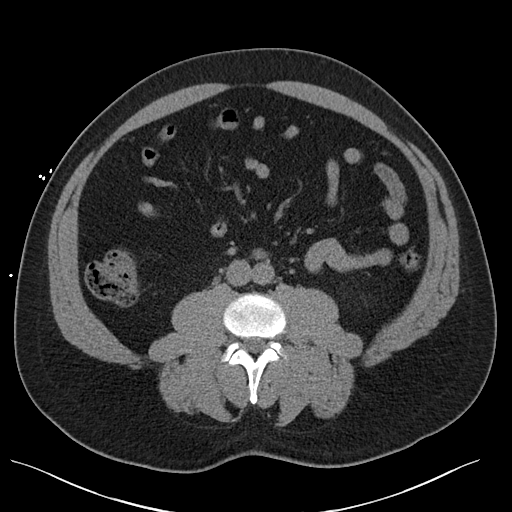
[im 61/100  soft-tissue]
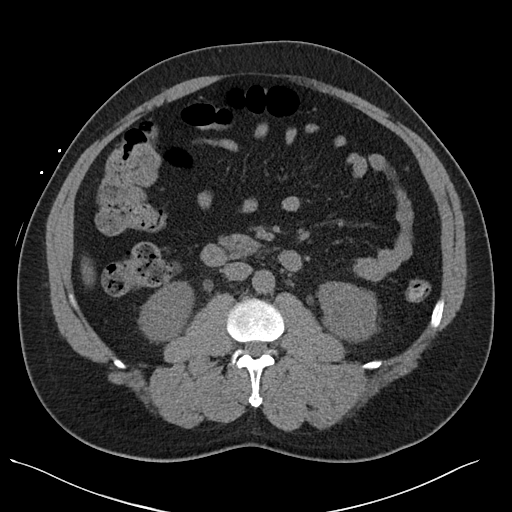
[im 69/100  soft-tissue]
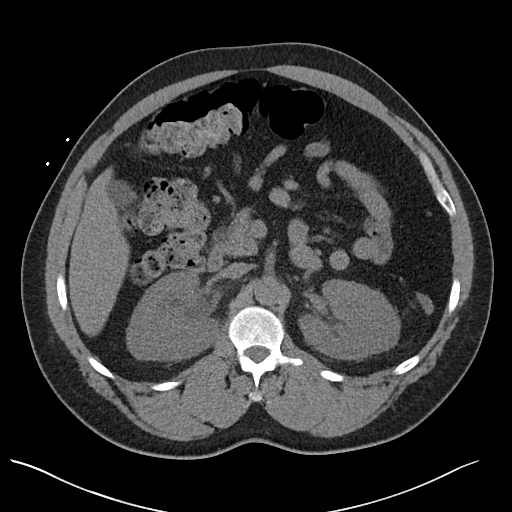
[im 69/100  bone]
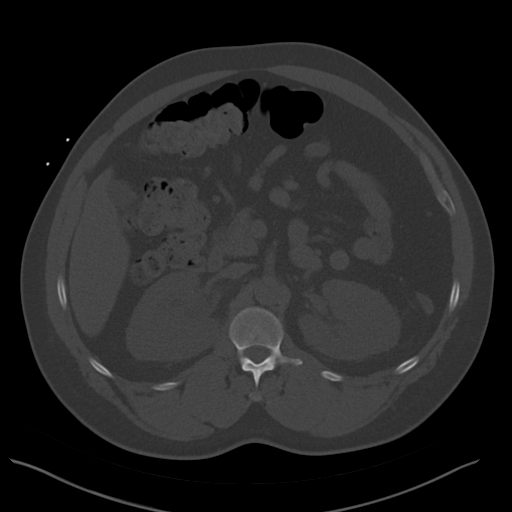
[im 77/100  soft-tissue]
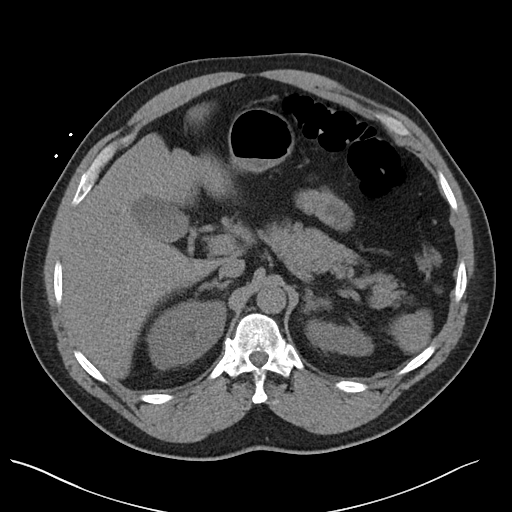
[im 84/100  soft-tissue]
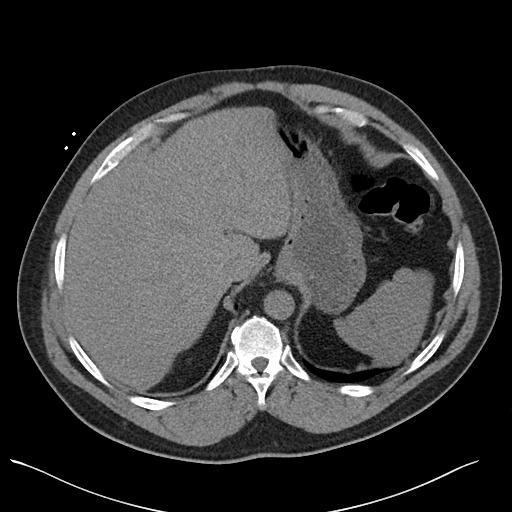
[im 92/100  soft-tissue]
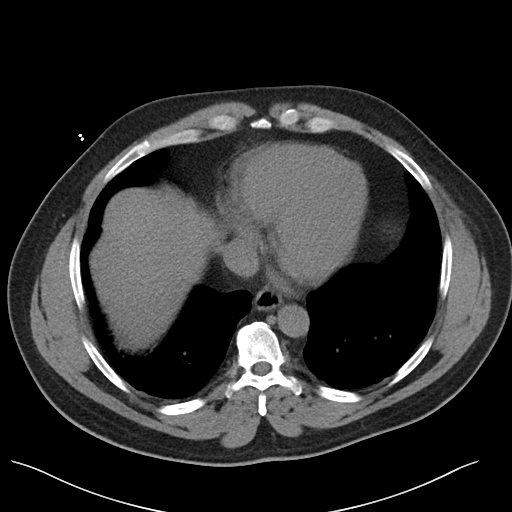

[Series 6: coronal · coronal · 0.70mm/px · 3 of 111 slices shown]
[im 37/111  soft-tissue]
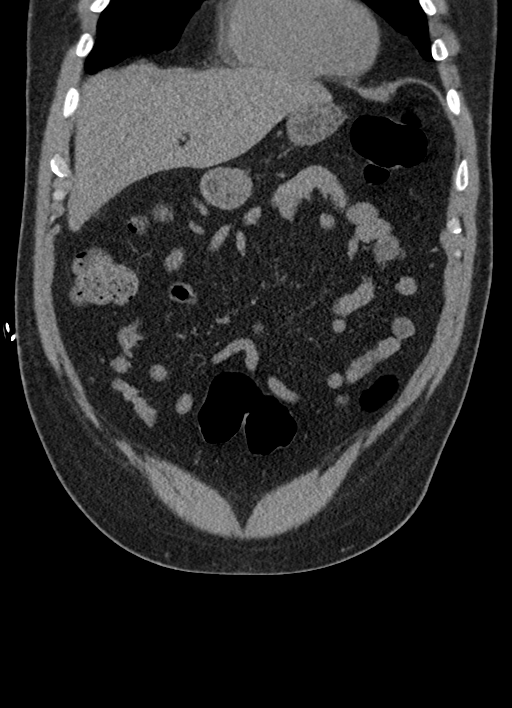
[im 49/111  soft-tissue]
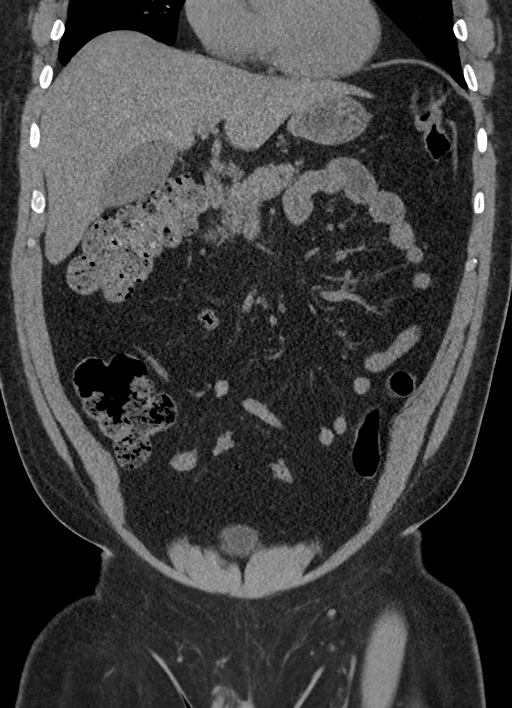
[im 62/111  soft-tissue]
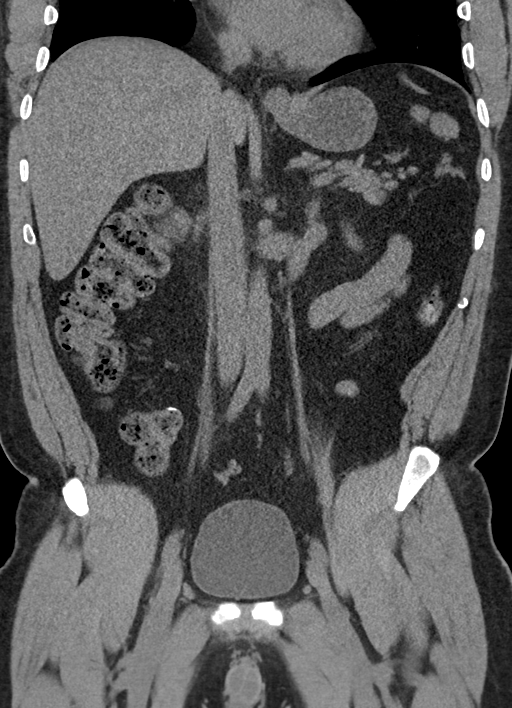

[15 of 46 positions shown; findings below may reference images not displayed]

FINDINGS: Lower chest: There is mild bibasilar atelectasis. Lung bases
otherwise are clear.

Hepatobiliary: No focal liver lesions are appreciable on this
noncontrast enhanced study. Gallbladder wall is not appreciably
thickened. There is no biliary duct dilatation.

Pancreas: There is no pancreatic mass or inflammatory focus.

Spleen: No splenic lesions are evident.

Adrenals/Urinary Tract: No splenic lesions are evident. There is no
appreciable renal mass on either side. There is moderate
hydronephrosis on the right. There is no appreciable hydronephrosis
on the left. There is no appreciable intrarenal calculus on either
side. There is a 2 x 1 mm calculus at the right ureterovesical
junction. No other ureteral calculi are appreciable on either side.
Urinary bladder is midline with wall thickness within normal limits.

Stomach/Bowel: There is no appreciable bowel wall or mesenteric
thickening. There is no evident bowel obstruction. The terminal
ileum appears normal. There is no evident free air or portal venous
air.

Vascular/Lymphatic: There is no abdominal aortic aneurysm. No
vascular lesions are evident on this noncontrast enhanced study. No
adenopathy is appreciable in the abdomen or pelvis.

Reproductive: Prostate and seminal vesicles are normal in size and
contour.

Other: Appendix absent. No periappendiceal region inflammation. No
abscess or ascites evident in the abdomen pelvis. There is mild fat
in the umbilicus.

Musculoskeletal: There is a stable small bone island in the left
sacrum. No lytic or destructive bone lesions are evident. No
intramuscular lesions are evident.
IMPRESSION: 1. There is a 2 x 1 mm calculus at the right ureterovesical junction
causing moderate hydronephrosis on the right.

2. No apparent bowel wall thickening or bowel obstruction. No
abscess in the abdomen or pelvis. Appendix absent. No
periappendiceal region inflammatory change.

## 2021-01-26 ENCOUNTER — Other Ambulatory Visit: Payer: Self-pay | Admitting: Medical

## 2021-01-27 NOTE — Telephone Encounter (Signed)
Looks like this was discontinued at [facility] on 11/12/20 by shane

## 2021-07-27 ENCOUNTER — Telehealth: Payer: Self-pay | Admitting: Medical

## 2021-07-27 ENCOUNTER — Other Ambulatory Visit: Payer: Self-pay | Admitting: Medical

## 2021-07-27 NOTE — Telephone Encounter (Signed)
Get in for med check within next month.  Time for lab check/med check on testosterone

## 2021-07-28 ENCOUNTER — Other Ambulatory Visit: Payer: Self-pay | Admitting: Medical

## 2021-07-28 MED ORDER — TESTOSTERONE 20.25 MG/1.25GM (1.62%) TD GEL: 3.0000 | Freq: Every day | TRANSDERMAL | 0 refills | Status: DC

## 2021-07-28 NOTE — Telephone Encounter (Signed)
done

## 2021-07-30 ENCOUNTER — Other Ambulatory Visit: Payer: Self-pay | Admitting: Medical

## 2021-08-11 ENCOUNTER — Ambulatory Visit: Payer: BC Managed Care – PPO | Admitting: Medical

## 2021-08-11 ENCOUNTER — Other Ambulatory Visit: Payer: Self-pay

## 2021-08-11 VITALS — BP 110/70 | HR 84 | Wt 233.4 lb

## 2021-08-11 DIAGNOSIS — Z23 Encounter for immunization: Secondary | ICD-10-CM | POA: Diagnosis not present

## 2021-08-11 DIAGNOSIS — G4700 Insomnia, unspecified: Secondary | ICD-10-CM | POA: Diagnosis not present

## 2021-08-11 DIAGNOSIS — N529 Male erectile dysfunction, unspecified: Secondary | ICD-10-CM | POA: Diagnosis not present

## 2021-08-11 DIAGNOSIS — L910 Hypertrophic scar: Secondary | ICD-10-CM

## 2021-08-11 DIAGNOSIS — R7989 Other specified abnormal findings of blood chemistry: Secondary | ICD-10-CM | POA: Diagnosis not present

## 2021-08-11 DIAGNOSIS — G4733 Obstructive sleep apnea (adult) (pediatric): Secondary | ICD-10-CM | POA: Diagnosis not present

## 2021-08-11 MED ORDER — TEMAZEPAM 7.5 MG PO CAPS: 7.5000 mg | ORAL_CAPSULE | Freq: Every evening | ORAL | 0 refills | Status: DC | PRN

## 2021-08-11 NOTE — Progress Notes (Signed)
Subjective:  Charles Rojas. is a 51 y.o. male who presents for Chief Complaint  Patient presents with   med check    Med check, having trouble sleeping. Declines flu shot but will get Covid shot today     Here for med check on testosterone.  Hasnt been using testosterone in several months.   Hasnt been concerned about.    Hx/o OSA diagnosis 2019 - not using CPAP, never tried one and not willing to use one.  Having trouble sleeping.  Getting 4-5 hours of sleep per night.  Curious about a sleep aid.  Has worked 2 jobs for 20+ years.  Quit the 2nd job 2 years ago.  But still having trouble sleepging.   Has not tried any sleep aids but wants too.  He notes having biopsy of skin of neck last year and now has a keloid.   No other aggravating or relieving factors.    No other c/o.  Past Medical History:  Diagnosis Date   Erectile dysfunction    Family history of prostate cancer    GERD (gastroesophageal reflux disease)    H/O oral surgery    Herpes    genital   Hyperlipidemia    Hypertension    Impaired fasting blood sugar    Insomnia    Insulin resistance    Low testosterone in male    Obesity    OSA (obstructive sleep apnea) 07/2017   Renal stone 2019   Current Outpatient Medications on File Prior to Visit  Medication Sig Dispense Refill   amlodipine-olmesartan (AZOR) 10-20 MG tablet Take 1 tablet by mouth daily. 90 tablet 3   rosuvastatin (CRESTOR) 10 MG tablet Take 1 tablet (10 mg total) by mouth at bedtime. 90 tablet 3   tadalafil (CIALIS) 20 MG tablet Take 1 tablet (20 mg total) by mouth daily as needed for erectile dysfunction. 10 tablet 5   valACYclovir (VALTREX) 1000 MG tablet TAKE 2 TABLETS BY MOUTH TWICE A DAY FOR 1 DAY FOR FLARE UP 20 tablet 0   aspirin EC 81 MG tablet Take 1 tablet (81 mg total) by mouth daily. (Patient not taking: Reported on 08/11/2021) 90 tablet 3   No current facility-administered medications on file prior to visit.     The following  portions of the patient's history were reviewed and updated as appropriate: allergies, current medications, past family history, past medical history, past social history, past surgical history and problem list.  ROS Otherwise as in subjective above    Objective: BP 110/70    Pulse 84    Wt 233 lb 6.4 oz (105.9 kg)    BMI 31.65 kg/m   Wt Readings from Last 3 Encounters:  08/11/21 233 lb 6.4 oz (105.9 kg)  11/18/20 213 lb (96.6 kg)  11/12/20 213 lb 9.6 oz (96.9 kg)     General appearance: alert, no distress, well developed, well nourished Anterior left neck with 42mm diameter raised scar suggestive of keloid   Assessment: Encounter Diagnoses  Name Primary?   Insomnia, unspecified type Yes   Low testosterone in male    Erectile dysfunction, unspecified erectile dysfunction type    OSA (obstructive sleep apnea)    Need for COVID-19 vaccine    Keloid      Plan: Insomnia -we discussed his concerns about sleep.  Discussed sleep hygiene.  He seems to be practicing good sleep hygiene modes.  He gets up 1 time a night to urinate but this is close to  the time he would be getting up anyhow.  He is more concerned about waking up in the middle the night.  We discussed strategies to help.  He also wants to start a sleep aid.  Discussed risk and benefits of medication.  Begin trial of Restoril.  OSA - 2019 diagnosis on sleep study.   Not willing to use CPAP.  I advise he work on weight loss as well as considering speaking to dentist about an oral airway device  Keloid-advise he follow back up with dermatology  Low testosterone history-he has no desire for therapy currently and is not on therapy currently.  Counseled on the Covid virus vaccine.  Vaccine information sheet given.  Covid vaccine given after consent obtained.  [name] was seen today for med check.  Diagnoses and all orders for this visit:  Insomnia, unspecified type  Low testosterone in male  Erectile dysfunction,  unspecified erectile dysfunction type  OSA (obstructive sleep apnea)  Need for COVID-19 vaccine -     Pfizer Covid-19 Vaccine Bivalent Booster  Keloid  Other orders -     temazepam (RESTORIL) 7.5 MG capsule; Take 1 capsule (7.5 mg total) by mouth at bedtime as needed for sleep.    Follow up: yearly for physical

## 2021-09-05 ENCOUNTER — Other Ambulatory Visit: Payer: Self-pay | Admitting: Medical

## 2021-11-20 ENCOUNTER — Ambulatory Visit: Payer: BC Managed Care – PPO | Admitting: Medical

## 2021-11-20 VITALS — BP 120/72 | HR 81 | Ht 72.0 in | Wt 232.8 lb

## 2021-11-20 DIAGNOSIS — Z0000 Encounter for general adult medical examination without abnormal findings: Secondary | ICD-10-CM | POA: Diagnosis not present

## 2021-11-20 DIAGNOSIS — I10 Essential (primary) hypertension: Secondary | ICD-10-CM | POA: Diagnosis not present

## 2021-11-20 DIAGNOSIS — G4733 Obstructive sleep apnea (adult) (pediatric): Secondary | ICD-10-CM

## 2021-11-20 DIAGNOSIS — Z125 Encounter for screening for malignant neoplasm of prostate: Secondary | ICD-10-CM

## 2021-11-20 DIAGNOSIS — Z8042 Family history of malignant neoplasm of prostate: Secondary | ICD-10-CM

## 2021-11-20 DIAGNOSIS — E785 Hyperlipidemia, unspecified: Secondary | ICD-10-CM

## 2021-11-20 DIAGNOSIS — N529 Male erectile dysfunction, unspecified: Secondary | ICD-10-CM

## 2021-11-20 DIAGNOSIS — E8881 Metabolic syndrome: Secondary | ICD-10-CM

## 2021-11-20 DIAGNOSIS — R7301 Impaired fasting glucose: Secondary | ICD-10-CM

## 2021-11-20 DIAGNOSIS — L910 Hypertrophic scar: Secondary | ICD-10-CM

## 2021-11-20 DIAGNOSIS — G4700 Insomnia, unspecified: Secondary | ICD-10-CM

## 2021-11-20 DIAGNOSIS — K219 Gastro-esophageal reflux disease without esophagitis: Secondary | ICD-10-CM

## 2021-11-20 NOTE — Progress Notes (Signed)
Subjective:  ? ?HPI ? Charles Rojas. is a 51 y.o. male who presents for ?Chief Complaint  ?Patient presents with  ? fasting cpe,  ?  Fasitng cpe , no concerns. Been out of cholesterol med and aspirin  ? ? ?Patient Care Team: ?Shant Hence, Leward Quan as PCP - General (Family Medicine) ?Sees dentist ?Sees eye doctor ?Dr. Juanita Craver, GI ? ?Concerns: ?Hypertension-compliant with medication without complaint ? ?Hyperlipidemia-compliant with medication without complaint, but ran out weeks ago ? ? ?Reviewed their medical, surgical, family, social, medication, and allergy history and updated chart as appropriate. ? ?Past Medical History:  ?Diagnosis Date  ? Erectile dysfunction   ? Family history of prostate cancer   ? GERD (gastroesophageal reflux disease)   ? H/O oral surgery   ? Herpes   ? genital  ? Hyperlipidemia   ? Hypertension   ? Impaired fasting blood sugar   ? Insomnia   ? Insulin resistance   ? Low testosterone in male   ? Obesity   ? OSA (obstructive sleep apnea) 07/2017  ? Renal stone 2019  ? ? ?Past Surgical History:  ?Procedure Laterality Date  ? APPENDECTOMY    ? COLONOSCOPY  2020  ? multiple polyps; Dr. Juanita Craver  ? MOUTH SURGERY    ? ? ?Family History  ?Problem Relation Age of Onset  ? Cancer Father   ?     prostate, age 79  ? Hyperlipidemia Father   ? Diabetes Mother   ? Hypertension Mother   ? Osteoarthritis Mother   ?     Knees  ? Hyperlipidemia Sister   ? Hypertension Sister   ? Cancer Paternal Uncle   ?     prostate  ? Cancer Maternal Uncle   ?     thyroid  ? Heart disease Neg Hx   ? Stroke Neg Hx   ? ? ? ?Current Outpatient Medications:  ?  amlodipine-olmesartan (AZOR) 10-20 MG tablet, TAKE 1 TABLET BY MOUTH EVERY DAY, Disp: 30 tablet, Rfl: 2 ?  tadalafil (CIALIS) 20 MG tablet, Take 1 tablet (20 mg total) by mouth daily as needed for erectile dysfunction., Disp: 10 tablet, Rfl: 5 ?  temazepam (RESTORIL) 7.5 MG capsule, Take 1 capsule (7.5 mg total) by mouth at bedtime as needed for sleep.,  Disp: 20 capsule, Rfl: 0 ?  aspirin EC 81 MG tablet, Take 1 tablet (81 mg total) by mouth daily. (Patient not taking: Reported on 08/11/2021), Disp: 90 tablet, Rfl: 3 ?  rosuvastatin (CRESTOR) 10 MG tablet, Take 1 tablet (10 mg total) by mouth at bedtime. (Patient not taking: Reported on 11/20/2021), Disp: 90 tablet, Rfl: 3 ?  valACYclovir (VALTREX) 1000 MG tablet, TAKE 2 TABLETS BY MOUTH TWICE A DAY FOR 1 DAY FOR FLARE UP (Patient not taking: Reported on 11/20/2021), Disp: 20 tablet, Rfl: 0 ? ?No Known Allergies ? ? ?Review of Systems ?Constitutional: -fever, -chills, -sweats, -unexpected weight change, -decreased appetite, -fatigue ?Allergy: -sneezing, -itching, -congestion ?Dermatology: -changing moles, --rash, -lumps ?ENT: -runny nose, -ear pain, -sore throat, -hoarseness, -sinus pain, -teeth pain, - ringing in ears, -hearing loss, -nosebleeds ?Cardiology: -chest pain, -palpitations, -swelling, -difficulty breathing when lying flat, -waking up short of breath ?Respiratory: -cough, -shortness of breath, -difficulty breathing with exercise or exertion, -wheezing, -coughing up blood ?Gastroenterology: -abdominal pain, -nausea, -vomiting, -diarrhea, -constipation, -blood in stool, -changes in bowel movement, -difficulty swallowing or eating ?Hematology: -bleeding, -bruising  ?Musculoskeletal: -joint aches, -muscle aches, -joint swelling, -back pain, -neck pain, -cramping, -  changes in gait ?Ophthalmology: denies vision changes, eye redness, itching, discharge ?Urology: -burning with urination, -difficulty urinating, -blood in urine, -urinary frequency, -urgency, -incontinence ?Neurology: -headache, -weakness, -tingling, -numbness, -memory loss, -falls, -dizziness ?Psychology: -depressed mood, -agitation, -sleep problems ?Male GU: no testicular mass, pain, no lymph nodes swollen, no swelling, no rash. ? ? ? ?  11/20/2021  ? 10:33 AM 08/11/2021  ? 10:17 AM 11/12/2020  ?  9:32 AM 08/28/2020  ?  9:15 AM 11/02/2019  ?  1:54 PM   ?Depression screen PHQ 2/9  ?Decreased Interest 0 0 0 0 0  ?Down, Depressed, Hopeless 0 0 0 0 0  ?PHQ - 2 Score 0 0 0 0 0  ?Altered sleeping     0  ?Tired, decreased energy     0  ?Change in appetite     0  ?Feeling bad or failure about yourself      0  ?Trouble concentrating     0  ?Moving slowly or fidgety/restless     0  ?Suicidal thoughts     0  ?PHQ-9 Score     0  ?Difficult doing work/chores     Not difficult at all  ? ? ?   ? ?Objective:  ?BP 120/72   Pulse 81   Ht 6' (1.829 m)   Wt 232 lb 12.8 oz (105.6 kg)   BMI 31.57 kg/m?  ? ?Wt Readings from Last 3 Encounters:  ?11/20/21 232 lb 12.8 oz (105.6 kg)  ?08/11/21 233 lb 6.4 oz (105.9 kg)  ?11/18/20 213 lb (96.6 kg)  ? ?BP Readings from Last 3 Encounters:  ?11/20/21 120/72  ?08/11/21 110/70  ?11/12/20 130/88  ? ?General appearance: alert, no distress, WD/WN, African American male ?Skin: Large keloid horizontally across the chest, keloid of the umbilicus, numerous skin tags of the anterior and left and right anterior neck ?HEENT: normocephalic, conjunctiva/corneas normal, sclerae anicteric, PERRLA, EOMi, nares patent, no discharge or erythema, pharynx normal ?Oral cavity: MMM, tongue normal, teeth normal ?Neck: supple, no lymphadenopathy, no thyromegaly, no masses, normal ROM, no bruits ?Chest: non tender, normal shape and expansion ?Heart: RRR, normal S1, S2, no murmurs ?Lungs: CTA bilaterally, no wheezes, rhonchi, or rales ?Abdomen: +bs, soft, non tender, non distended, no masses, no hepatomegaly, no splenomegaly, no bruits ?Back: non tender, normal ROM, no scoliosis ?Musculoskeletal: upper extremities non tender, no obvious deformity, normal ROM throughout, lower extremities non tender, no obvious deformity, normal ROM throughout ?Extremities: no edema, no cyanosis, no clubbing ?Pulses: 2+ symmetric, upper and lower extremities, normal cap refill ?Neurological: alert, oriented x 3, CN2-12 intact, strength normal upper extremities and lower extremities,  sensation normal throughout, DTRs 2+ throughout, no cerebellar signs, gait normal ?Psychiatric: normal affect, behavior normal, pleasant  ?GU: normal male external genitalia,circumcised, nontender, right superior spermatocele superior to testicle, otherwise no masses, no hernia, no lymphadenopathy ?Rectal: anus normal tone, prostate mildly enlarged ? ? ?Assessment and Plan :  ? ?Encounter Diagnoses  ?Name Primary?  ? Encounter for health maintenance examination in adult Yes  ? Erectile dysfunction, unspecified erectile dysfunction type   ? Essential hypertension, benign   ? Family history of prostate cancer in father   ? Gastroesophageal reflux disease, unspecified whether esophagitis present   ? Hyperlipidemia, unspecified hyperlipidemia type   ? Impaired fasting blood sugar   ? Insomnia, unspecified type   ? Insulin resistance   ? Keloid   ? OSA (obstructive sleep apnea)   ? Screening for prostate cancer   ? ? ?  This visit was a preventative care visit, also known as wellness visit or routine physical.   Topics typically include healthy lifestyle, diet, exercise, preventative care, vaccinations, sick and well care, proper use of emergency dept and after hours care, as well as other concerns.   ? ? ?Recommendations: ?Continue to return yearly for your annual wellness and preventative care visits.  This gives [place] a chance to discuss healthy lifestyle, exercise, vaccinations, review your chart record, and perform screenings where appropriate. ? ?I recommend you see your eye doctor yearly for routine vision care. ? ?I recommend you see your dentist yearly for routine dental care including hygiene visits twice yearly. ? ? ?Vaccination recommendations were reviewed ?Immunization History  ?Administered Date(s) Administered  ? Moderna Sars-Covid-2 Vaccination 06/15/2020  ? PFIZER(Purple Top)SARS-COV-2 Vaccination 09/07/2019, 09/28/2019, 12/04/2020  ? Pension scheme manager 73yr & up 08/11/2021  ? Td  04/14/1995  ? Tdap 12/19/2013  ? Zoster Recombinat (Shingrix) 12/04/2020, 03/07/2021  ? ?Screening for cancer: ?Colon cancer screening: ?I reviewed your colonoscopy on file that is up to date from 2020 ? ?We discus

## 2021-11-21 LAB — PSA: Prostate Specific Ag, Serum: 0.8 ng/mL (ref 0.0–4.0)

## 2021-11-21 LAB — COMPREHENSIVE METABOLIC PANEL
ALT: 17 IU/L (ref 0–44)
AST: 16 IU/L (ref 0–40)
Albumin/Globulin Ratio: 1.5 (ref 1.2–2.2)
Albumin: 4.4 g/dL (ref 3.8–4.9)
Alkaline Phosphatase: 73 IU/L (ref 44–121)
BUN/Creatinine Ratio: 11 (ref 9–20)
BUN: 12 mg/dL (ref 6–24)
Bilirubin Total: 0.5 mg/dL (ref 0.0–1.2)
CO2: 24 mmol/L (ref 20–29)
Calcium: 9.5 mg/dL (ref 8.7–10.2)
Chloride: 102 mmol/L (ref 96–106)
Creatinine, Ser: 1.09 mg/dL (ref 0.76–1.27)
Globulin, Total: 3 g/dL (ref 1.5–4.5)
Glucose: 83 mg/dL (ref 70–99)
Potassium: 4.4 mmol/L (ref 3.5–5.2)
Sodium: 139 mmol/L (ref 134–144)
Total Protein: 7.4 g/dL (ref 6.0–8.5)
eGFR: 82 mL/min/{1.73_m2} (ref >59)

## 2021-11-21 LAB — LIPID PANEL
Chol/HDL Ratio: 4.6 ratio (ref 0.0–5.0)
Cholesterol, Total: 208 mg/dL — ABNORMAL HIGH (ref 100–199)
HDL: 45 mg/dL (ref >39)
LDL Chol Calc (NIH): 146 mg/dL — ABNORMAL HIGH (ref 0–99)
Triglycerides: 92 mg/dL (ref 0–149)
VLDL Cholesterol Cal: 17 mg/dL (ref 5–40)

## 2021-11-21 LAB — CBC
Hematocrit: 42.8 % (ref 37.5–51.0)
Hemoglobin: 13.6 g/dL (ref 13.0–17.7)
MCH: 23.7 pg — ABNORMAL LOW (ref 26.6–33.0)
MCHC: 31.8 g/dL (ref 31.5–35.7)
MCV: 75 fL — ABNORMAL LOW (ref 79–97)
Platelets: 391 10*3/uL (ref 150–450)
RBC: 5.73 x10E6/uL (ref 4.14–5.80)
RDW: 13.7 % (ref 11.6–15.4)
WBC: 7.2 10*3/uL (ref 3.4–10.8)

## 2021-11-21 LAB — HEMOGLOBIN A1C
Est. average glucose Bld gHb Est-mCnc: 123 mg/dL
Hgb A1c MFr Bld: 5.9 % — ABNORMAL HIGH (ref 4.8–5.6)

## 2021-11-21 LAB — IRON: Iron: 108 ug/dL (ref 38–169)

## 2021-11-23 ENCOUNTER — Encounter: Payer: Self-pay | Admitting: Internal Medicine

## 2021-11-23 ENCOUNTER — Other Ambulatory Visit: Payer: Self-pay | Admitting: Medical

## 2021-11-23 MED ORDER — TEMAZEPAM 15 MG PO CAPS: 15.0000 mg | ORAL_CAPSULE | Freq: Every evening | ORAL | 2 refills | Status: DC | PRN

## 2021-11-23 MED ORDER — TADALAFIL 20 MG PO TABS: 20.0000 mg | ORAL_TABLET | Freq: Every day | ORAL | 5 refills | Status: DC | PRN

## 2021-11-23 MED ORDER — ASPIRIN EC 81 MG PO TBEC: 81.0000 mg | DELAYED_RELEASE_TABLET | Freq: Every day | ORAL | 3 refills | Status: DC

## 2021-11-23 MED ORDER — ROSUVASTATIN CALCIUM 10 MG PO TABS: 10.0000 mg | ORAL_TABLET | Freq: Every day | ORAL | 3 refills | Status: DC

## 2021-11-23 MED ORDER — AMLODIPINE-OLMESARTAN 10-20 MG PO TABS: 1.0000 | ORAL_TABLET | Freq: Every day | ORAL | 3 refills | Status: DC

## 2021-11-23 NOTE — Telephone Encounter (Signed)
Left message for pt to call me back 

## 2021-11-24 NOTE — Telephone Encounter (Signed)
Pt wants to check with insurance first and will give [place] a call back ?

## 2021-12-21 ENCOUNTER — Other Ambulatory Visit: Payer: Self-pay | Admitting: Medical

## 2022-05-21 ENCOUNTER — Ambulatory Visit (HOSPITAL_COMMUNITY)
Admission: EM | Admit: 2022-05-21 | Discharge: 2022-05-21 | Disposition: A | Discharge status: Home / Self Care | Payer: BC Managed Care – PPO | Attending: Internal Medicine | Admitting: Internal Medicine

## 2022-05-21 ENCOUNTER — Encounter (HOSPITAL_COMMUNITY): Payer: Self-pay

## 2022-05-21 DIAGNOSIS — G5611 Other lesions of median nerve, right upper limb: Secondary | ICD-10-CM

## 2022-05-21 MED ORDER — IBUPROFEN 800 MG PO TABS
ORAL_TABLET | ORAL | Status: AC
  Filled 2022-05-21: qty 1

## 2022-05-21 MED ORDER — IBUPROFEN 800 MG PO TABS
800.0000 mg | ORAL_TABLET | Freq: Once | ORAL | Status: AC
  Administered 2022-05-21: 800 mg via ORAL

## 2022-05-21 NOTE — Discharge Instructions (Addendum)
Take ibuprofen '600mg'$  ('200mg'$  x 3) every 6 hours as needed for pain and inflammation.   You may also use Tylenol every 6 hours as needed to help with your pain.  Purchase a tennis elbow brace and attempt to use this to help with your symptoms.  Provide heat and gentle massages to your upper arm.  Follow-up with orthopedic provider and PCP listed on paperwork.  If you develop any new or worsening symptoms or do not improve in the next 2 to 3 days, please return.  If your symptoms are severe, please go to the emergency room.  Follow-up with your primary care provider for further evaluation and management of your symptoms as well as ongoing wellness visits.  I hope you feel better!

## 2022-05-21 NOTE — ED Triage Notes (Signed)
Pain in the upper right arm into the shoulder. Today while eating lunch the pain was shooting into the lower right arm. Onset 3 weeks ago. Has been using pain ointment with only alight relief.   No falls, injuries, or heavy lifting. No previous right arm injuries. No swelling.

## 2022-05-23 NOTE — ED Provider Notes (Signed)
[place]    CSN: [id number] Arrival date & time: 05/21/22  1413      History   Chief Complaint Chief Complaint  Patient presents with   Arm Pain    HPI Charles Rojas. is a 51 y.o. male.   Patient presents urgent care for evaluation of right arm pain near the bicep muscle that intermittently radiates to the right shoulder and to the right forearm starting 3 weeks ago.  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  He is right arm dominant.  No history of carpal tunnel syndrome and he is never experienced these type of symptoms in the past.  He has been using Tylenol and topical Tiger balm/other pain relieving ointments to the right bicep area without relief of pain.  Movement and certain positions make pain worse.  He has not attempted use of any NSAID therapy prior to arrival urgent care.  Pain is currently a 7 on a scale of 0-10 and can be described as a sharp throbbing/mild burning sensation.   Arm Pain    Past Medical History:  Diagnosis Date   Erectile dysfunction    Family history of prostate cancer    GERD (gastroesophageal reflux disease)    H/O oral surgery    Herpes    genital   Hyperlipidemia    Hypertension    Impaired fasting blood sugar    Insomnia    Insulin resistance    Low testosterone in male    Obesity    OSA (obstructive sleep apnea) 07/2017   Renal stone 2019    Patient Active Problem List   Diagnosis Date Noted   Low testosterone 08/11/2021   Need for COVID-19 vaccine 08/11/2021   Hyperlipidemia 12/27/2019   Low testosterone in male 11/02/2019   Gastroesophageal reflux disease 11/02/2019   Insulin resistance 09/14/2018   Impaired fasting blood sugar 09/14/2018   Keloid 09/14/2018   OSA (obstructive sleep apnea) 08/23/2017   Screen for colon cancer 07/06/2017   Essential hypertension, benign 07/06/2017   Snoring 07/06/2017   Chronic pain of left  knee 01/17/2017   Screening for prostate cancer 02/12/2016   Obesity 02/12/2016   Family history of prostate cancer in father 02/12/2016   Encounter for health maintenance examination in adult 02/12/2016   Genital herpes 02/12/2016   Insomnia 02/12/2016   Erectile dysfunction 02/12/2016   Skin tag 02/12/2016    Past Surgical History:  Procedure Laterality Date   APPENDECTOMY     COLONOSCOPY  2020   multiple polyps; Dr. Juanita Craver   MOUTH SURGERY         Home Medications    Prior to Admission medications   Medication Sig Start Date End Date Taking? Authorizing Provider  amlodipine-olmesartan (AZOR) 10-20 MG tablet Take 1 tablet by mouth daily. 11/23/21  Yes Tysinger, Camelia Eng, PA-C  aspirin EC 81 MG tablet Take 1 tablet (81 mg total) by mouth daily. 11/23/21  Yes Tysinger, Camelia Eng, PA-C  rosuvastatin (CRESTOR) 10 MG tablet Take 1 tablet (10 mg total) by mouth at bedtime. 11/23/21 11/23/22 Yes Tysinger, Camelia Eng, PA-C  tadalafil (CIALIS) 20 MG tablet Take 1 tablet (20 mg total) by mouth daily as needed for erectile dysfunction. 11/23/21  Yes Tysinger, Camelia Eng, PA-C  temazepam (RESTORIL) 15 MG capsule Take 1 capsule (15 mg total) by mouth at bedtime as needed for sleep. 11/23/21  Yes  Tysinger, Camelia Eng, PA-C  temazepam (RESTORIL) 7.5 MG capsule Take 1 capsule (7.5 mg total) by mouth at bedtime as needed for sleep. 08/11/21  Yes Tysinger, Camelia Eng, PA-C  valACYclovir (VALTREX) 1000 MG tablet TAKE 2 TABLETS BY MOUTH TWICE A DAY FOR 1 DAY FOR FLARE UP 12/21/21  Yes Tysinger, Camelia Eng, PA-C    Family History Family History  Problem Relation Age of Onset   Cancer Father        prostate, age 38   Hyperlipidemia Father    Diabetes Mother    Hypertension Mother    Osteoarthritis Mother        Knees   Hyperlipidemia Sister    Hypertension Sister    Cancer Paternal Uncle        prostate   Cancer Maternal Uncle        thyroid   Heart disease Neg Hx    Stroke Neg Hx     Social  History Social History   Tobacco Use   Smoking status: Never   Smokeless tobacco: Never  Vaping Use   Vaping Use: Never used  Substance Use Topics   Alcohol use: Yes    Comment: rarely   Drug use: No     Allergies   Patient has no known allergies.   Review of Systems Review of Systems Per HPI  Physical Exam Triage Vital Signs ED Triage Vitals  Enc Vitals Group     BP 05/21/22 1436 126/84     Pulse Rate 05/21/22 1436 85     Resp 05/21/22 1436 16     Temp 05/21/22 1436 98.3 F (36.8 C)     Temp Source 05/21/22 1436 Oral     SpO2 05/21/22 1436 95 %     Weight --      Height --      Head Circumference --      Peak Flow --      Pain Score 05/21/22 1435 7     Pain Loc --      Pain Edu? --      Excl. in [place]? --    No data found.  Updated Vital Signs BP 126/84 (BP Location: Left Arm)   Pulse 85   Temp 98.3 F (36.8 C) (Oral)   Resp 16   SpO2 95%   Visual Acuity Right Eye Distance:   Left Eye Distance:   Bilateral Distance:    Right Eye Near:   Left Eye Near:    Bilateral Near:     Physical Exam Vitals and nursing note reviewed.  Constitutional:      Appearance: He is not ill-appearing or toxic-appearing.  HENT:     Head: Normocephalic and atraumatic.     Right Ear: Hearing and external ear normal.     Left Ear: Hearing and external ear normal.     Nose: Nose normal.     Mouth/Throat:     Lips: Pink.     Mouth: Mucous membranes are moist.  Eyes:     General: Lids are normal. Vision grossly intact. Gaze aligned appropriately.     Extraocular Movements: Extraocular movements intact.     Conjunctiva/sclera: Conjunctivae normal.  Cardiovascular:     Rate and Rhythm: Normal rate and regular rhythm.     Heart sounds: Normal heart sounds, S1 normal and S2 normal.  Pulmonary:     Effort: Pulmonary effort is normal. No respiratory distress.     Breath sounds: Normal breath sounds and air  entry.  Musculoskeletal:     Right shoulder: Normal.     Left  shoulder: Normal.     Right upper arm: Tenderness present. No swelling, edema, deformity, lacerations or bony tenderness.     Right elbow: Normal.     Right forearm: Normal.     Cervical back: Neck supple.     Comments: Tenderness to palpation of the distal bicep muscle.  Range of motion to the right shoulder, right elbow, and right wrist normal.  Positive Phalen's to the right side eliciting shooting pain to the right upper arm.  He is neurovascularly intact to the right upper extremity.  5/5 grip strength to bilateral upper extremities.  +2 radial pulse to the RUE, sensation intact distal to tenderness, less than 3 cap refill to right hand.   Skin:    General: Skin is warm and dry.     Capillary Refill: Capillary refill takes less than 2 seconds.     Findings: No rash.  Neurological:     General: No focal deficit present.     Mental Status: He is alert and oriented to person, place, and time. Mental status is at baseline.     Cranial Nerves: No dysarthria or facial asymmetry.  Psychiatric:        Mood and Affect: Mood normal.        Speech: Speech normal.        Behavior: Behavior normal.        Thought Content: Thought content normal.        Judgment: Judgment normal.      UC Treatments / Results  Labs (all labs ordered are listed, but only abnormal results are displayed) Labs Reviewed - No data to display  EKG   Radiology No results found.  Procedures Procedures (including critical care time)  Medications Ordered in UC Medications  ibuprofen (ADVIL) tablet 800 mg (800 mg Oral Given 05/21/22 1547)    Initial Impression / Assessment and Plan / UC Course  I have reviewed the triage vital signs and the nursing notes.  Pertinent labs & imaging results that were available during my care of the patient were reviewed by me and considered in my medical decision making (see chart for details).   1.  Median neuropathy upper arm Presentation is consistent with median nerve  compression/neuropathy of the right upper arm that will likely resolve and improve with NSAID, rest, and heat and gentle massages to the upper arm.  Tennis elbow brace may be helpful, patient advised to purchase this online and begin using it when he is at work to provide comfort and stability to the arm/wrists.  800 mg ibuprofen given in clinic to help with discomfort and inflammation.  He may use 600 mg ibuprofen every 6 hours as needed for pain and inflammation at home.  Walking referral given to orthopedic provider should symptoms fail to improve in the next 1 to 2 weeks.  PCP follow-up recommended for ongoing care.   Discussed physical exam and available lab work findings in clinic with patient.  Counseled patient regarding appropriate use of medications and potential side effects for all medications recommended or prescribed today. Discussed red flag signs and symptoms of worsening condition,when to call the PCP office, return to urgent care, and when to seek higher level of care in the emergency department. Patient verbalizes understanding and agreement with plan. All questions answered. Patient discharged in stable condition.    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   You may also use Tylenol every 6 hours as needed to help with your pain.  Purchase a tennis elbow brace and attempt to use this to help with your symptoms.  Provide heat and gentle massages to your upper arm.  Follow-up with orthopedic provider and PCP listed on paperwork.  If you develop any new or worsening symptoms or do not improve in the next 2 to 3 days, please return.  If your symptoms are severe, please go to the emergency room.  Follow-up with your primary care provider for further evaluation and management of your symptoms as well  as ongoing wellness visits.  I hope you feel better!   ED Prescriptions   None    PDMP not reviewed this encounter.   Talbot Grumbling, [place] 05/23/22 2152

## 2022-05-27 ENCOUNTER — Ambulatory Visit: Payer: BC Managed Care – PPO | Admitting: Medical

## 2022-05-27 VITALS — BP 120/70 | HR 75 | Temp 97.9°F | Wt 244.0 lb

## 2022-05-27 DIAGNOSIS — M79601 Pain in right arm: Secondary | ICD-10-CM

## 2022-05-27 DIAGNOSIS — R202 Paresthesia of skin: Secondary | ICD-10-CM | POA: Diagnosis not present

## 2022-05-27 DIAGNOSIS — Z23 Encounter for immunization: Secondary | ICD-10-CM

## 2022-05-27 MED ORDER — GABAPENTIN 100 MG PO CAPS: 100.0000 mg | ORAL_CAPSULE | Freq: Two times a day (BID) | ORAL | 0 refills | Status: DC

## 2022-05-27 MED ORDER — PREDNISONE 10 MG PO TABS: ORAL_TABLET | ORAL | 0 refills | Status: DC

## 2022-05-27 NOTE — Progress Notes (Signed)
Subjective:  Charles Rojas. is a 51 y.o. male who presents for Chief Complaint  Patient presents with   right upper arm pain    Right upper arm pain x 3 weeks.      3 weeks ago out of the blue started having pain in right upper arm and shoulder. Hard to raise arm to put on shirt.  Last weekend hand pain shooting down arm.  Went to urgent care, advised ibuprofen, compression sleeve for elbow. Got some relief but still not better.   Denies injury, trauma.  Initially pain in right upper arm muscles, was using Voltaren gel, tiger balm.  Just using the arm aggravates the arm. He had to change a flat tire this weekend which aggravated the pain.  No other aggravating or relieving factors.    No other c/o.  Past Medical History:  Diagnosis Date   Erectile dysfunction    Family history of prostate cancer    GERD (gastroesophageal reflux disease)    H/O oral surgery    Herpes    genital   Hyperlipidemia    Hypertension    Impaired fasting blood sugar    Insomnia    Insulin resistance    Low testosterone in male    Obesity    OSA (obstructive sleep apnea) 07/2017   Renal stone 2019   Current Outpatient Medications on File Prior to Visit  Medication Sig Dispense Refill   amlodipine-olmesartan (AZOR) 10-20 MG tablet Take 1 tablet by mouth daily. 90 tablet 3   aspirin EC 81 MG tablet Take 1 tablet (81 mg total) by mouth daily. 90 tablet 3   rosuvastatin (CRESTOR) 10 MG tablet Take 1 tablet (10 mg total) by mouth at bedtime. 90 tablet 3   tadalafil (CIALIS) 20 MG tablet Take 1 tablet (20 mg total) by mouth daily as needed for erectile dysfunction. 10 tablet 5   temazepam (RESTORIL) 15 MG capsule Take 1 capsule (15 mg total) by mouth at bedtime as needed for sleep. 30 capsule 2   valACYclovir (VALTREX) 1000 MG tablet TAKE 2 TABLETS BY MOUTH TWICE A DAY FOR 1 DAY FOR FLARE UP 20 tablet 0   No current facility-administered medications on file prior to visit.     The following portions of  the patient's history were reviewed and updated as appropriate: allergies, current medications, past family history, past medical history, past social history, past surgical history and problem list.  ROS Otherwise as in subjective above  Objective: BP 120/70   Pulse 75   Temp 97.9 F (36.6 C)   Wt 244 lb (110.7 kg)   BMI 33.09 kg/m   General appearance: alert, no distress, well developed, well nourished Arm nontender to palpation, range of motion seems relatively full, although he seems to have some pain in his right upper arm/triceps with range of motion overhead but nontender to palpation.  He does have some mild pain with empty can test and apprehension test but again the pain is in the right upper arm not in the shoulder or elbow or otherwise.  Negative for other special test, no other deformity or swelling Arms neurovascularly intact Neck normal range of motion without tenderness Negative Apley test    Assessment: Encounter Diagnoses  Name Primary?   Paresthesia Yes   Pain of right upper extremity    COVID-19 vaccine administered      Plan: Discussed symptoms, concerns, recommendations as below.   Recommendations: Avoid propping your arms on the elbows Avoid  sleeping directly on the arm or with the arms bent in a 90 degree angle where you are putting pressure on the arm Begin prednisone steroid taper in the mornings for the next week.  While you are on prednisone do not take any other anti-inflammatory such as ibuprofen or Aleve or Advil or Motrin Begin gabapentin neuropathic pain medication.  Begin 1 tablet or 1 capsule daily at bedtime for the first couple days then go to twice daily as long as it does not make you too sleepy Use the gabapentin for the next 2 to 3 weeks If things are much improved within 3 weeks then continue measures to avoid making this worse again If not much better or if the symptoms recur in the next 3 to 4 weeks then we may need to send you to  orthopedics  Counseled on the Covid virus vaccine.  Vaccine information sheet given.  Covid vaccine given after consent obtained.   Smaran was seen today for right upper arm pain.  Diagnoses and all orders for this visit:  Paresthesia  Pain of right upper extremity  COVID-19 vaccine administered -     Pfizer Fall 2023 Covid-19 Vaccine 71yr and older  Other orders -     predniSONE (DELTASONE) 10 MG tablet; 6 tablets all together day 1, 5 tablets day 2, 4 tablets day 3, 3 tablets day 4, 2 tablets day 5, 1 tablet day 6. -     gabapentin (NEURONTIN) 100 MG capsule; Take 1 capsule (100 mg total) by mouth 2 (two) times daily.    Follow up: prn

## 2022-05-27 NOTE — Patient Instructions (Signed)
Recommendations: Avoid propping your arms on the elbows Avoid sleeping directly on the arm or with the arms bent in a 90 degree angle where you are putting pressure on the arm Begin prednisone steroid taper in the mornings for the next week.  While you are on prednisone do not take any other anti-inflammatory such as ibuprofen or Aleve or Advil or Motrin Begin gabapentin neuropathic pain medication.  Begin 1 tablet or 1 capsule daily at bedtime for the first couple days then go to twice daily as long as it does not make you too sleepy Use the gabapentin for the next 2 to 3 weeks If things are much improved within 3 weeks then continue measures to avoid making this worse again If not much better or if the symptoms recur in the next 3 to 4 weeks then we may need to send you to orthopedics     Cubital Tunnel Syndrome  Cubital tunnel syndrome is a condition that causes pain and weakness of the forearm and hand. It happens when one of the nerves that runs along the inside of the elbow joint (ulnar nerve) becomes irritated. This condition is usually caused by repeated arm motions that are done during sports or work-related activities. What are the causes? This condition may be caused by: Increased pressure on the ulnar nerve at the elbow, arm, or forearm. This can result from: Irritation caused by repeated elbow bending. Poorly healed elbow fractures. Tumors in the elbow. These are usually noncancerous (benign). Scar tissue that develops in the elbow after an injury. Bony growths (spurs) near the ulnar nerve. Stretching of the nerve due to loose elbow ligaments. Trauma to the nerve at the elbow. What increases the risk? The following factors may make you more likely to develop this condition: Doing manual labor that requires frequent bending of the elbow. Playing sports that include repeated or strenuous throwing motions, such as baseball. Playing contact sports, such as football or  lacrosse. Not warming up properly before activities. Having diabetes. Having an underactive thyroid (hypothyroidism). What are the signs or symptoms? Symptoms of this condition include: Clumsiness or weakness of the hand. Tenderness of the inner elbow. Aching or soreness of the inner elbow, forearm, or fingers, especially the little finger or the ring finger. Increased pain when forcing the elbow to bend. Reduced control when throwing objects. Tingling, numbness, or a burning feeling inside the forearm or in part of the hand or fingers, especially the little finger or the ring finger. Sharp pains that shoot from the elbow down to the wrist and hand. The inability to grip or pinch hard. How is this diagnosed? This condition is diagnosed based on: Your symptoms and medical history. Your health care provider will also ask for details about any injury. A physical exam. You may also have tests, including: Electromyogram (EMG). This test measures electrical signals sent by your nerves into the muscles. Nerve conduction study. This test measures how well electrical signals pass through your nerves. Imaging tests, such as X-rays, ultrasound, and MRI. These tests check for possible causes of your condition. How is this treated? This condition may be treated by: Stopping the activities that are causing your symptoms to get worse. Icing and taking medicines to reduce pain and swelling. Wearing a splint to prevent your elbow from bending, or wearing an elbow pad where the ulnar nerve is closest to the skin. Working with a physical therapist in less severe cases. This may help to: Decrease your symptoms. Improve the  strength and range of motion of your elbow, forearm, and hand. If these treatments do not help, surgery may be needed. Follow these instructions at home: If you have a splint: Wear the splint as told by your health care provider. Remove it only as told by your health care  provider. Loosen the splint if your fingers tingle, become numb, or turn cold and blue. Keep the splint clean. If the splint is not waterproof: Do not let it get wet. Cover it with a watertight covering when you take a bath or shower. Managing pain, stiffness, and swelling  If directed, put ice on the injured area: Put ice in a plastic bag. Place a towel between your skin and the bag. Leave the ice on for 20 minutes, 2-3 times a day. Move your fingers often to avoid stiffness and to lessen swelling. Raise (elevate) the injured area above the level of your heart while you are sitting or lying down. General instructions Take over-the-counter and prescription medicines only as told by your health care provider. Do any exercise or physical therapy as told by your health care provider. Do not drive or use heavy machinery while taking prescription pain medicine. If you were given an elbow pad, wear it as told by your health care provider. Keep all follow-up visits as told by your health care provider. This is important. Contact a health care provider if: Your symptoms get worse. Your symptoms do not get better with treatment. You have new pain. Your hand on the injured side feels numb or cold. Summary Cubital tunnel syndrome is a condition that causes pain and weakness of the forearm and hand. You are more likely to develop this condition if you do work or play sports that involve repeated arm movements. This condition is often treated by stopping repetitive activities, applying ice, and using anti-inflammatory medicines. In rare cases, surgery may be needed. This information is not intended to replace advice given to you by your health care provider. Make sure you discuss any questions you have with your health care provider. Document Revised: 08/19/2020 Document Reviewed: 08/20/2020 Elsevier Patient Education  [place].

## 2022-07-07 ENCOUNTER — Ambulatory Visit: Payer: BC Managed Care – PPO | Admitting: Medical

## 2022-07-07 ENCOUNTER — Encounter: Payer: Self-pay | Admitting: Medical

## 2022-07-07 VITALS — BP 130/86 | HR 95 | Resp 16 | Wt 250.4 lb

## 2022-07-07 DIAGNOSIS — M25511 Pain in right shoulder: Secondary | ICD-10-CM

## 2022-07-07 DIAGNOSIS — L989 Disorder of the skin and subcutaneous tissue, unspecified: Secondary | ICD-10-CM

## 2022-07-07 DIAGNOSIS — M79601 Pain in right arm: Secondary | ICD-10-CM

## 2022-07-07 DIAGNOSIS — G8929 Other chronic pain: Secondary | ICD-10-CM | POA: Diagnosis not present

## 2022-07-07 NOTE — Progress Notes (Signed)
Subjective:  Charles Rojas. is a 51 y.o. male who presents for Chief Complaint  Patient presents with   place on bottom    Pt states he has a mole on left side of buttocks States he noticed a few months ago started off small has grown in size Denies any pain around area or discoloration      Here for 2 concerns.  He notes a skin lesion appeared on his left buttock 2 to 3 months ago and it has grown in size somewhat quickly.  He is concerned about the rapid growth.  No other similar lesion.  He has seen Dr. Nevada Crane dermatology in the past.  He is still having ongoing right shoulder and arm pain.  It is not consistent or intermittent but fairly regular.  He has pain sometimes in the hand symptoms in the forearm the more often than the right upper arm.  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  Sometimes feels a tingling down the arm but no numbness.  Sometimes the hand feels weak.  No fall or injury or trauma.  Last visit we started prednisone and gabapentin.  He got some initial relief but does not like the gabapentin is helping.  He is using some ibuprofen over-the-counter.  No other aggravating or relieving factors.    No other c/o.  The following portions of the patient's history were reviewed and updated as appropriate: allergies, current medications, past family history, past medical history, past social history, past surgical history and problem list.  ROS Otherwise as in subjective above    Objective: BP 130/86   Pulse 95   Resp 16   Wt 250 lb 6 oz (113.6 kg)   SpO2 96%   BMI 33.96 kg/m   General appearance: alert, no distress, well developed, well nourished Skin: left buttock laterally with 2.4 cm x  1.5 cm  oval, brownish flat topped raised lesion with medial portion with some newer pink/brown changes/growth, dense thick lesion Right arm nontender with normal range of motion, he does seem to have some pain with  shoulder flexion and rotator cuff test, but no overhead motion and pain like he had last visit and negative apprehension test today, no obvious weakness of arm.  Arm neurovascularly intact overall neck normal range of motion nontender no masses or thyromegaly No arm edema or skin changes    Assessment: Encounter Diagnoses  Name Primary?   Skin lesion Yes   Chronic right shoulder pain    Pain of right upper extremity      Plan: Skin lesion been present for few months but growing and greater than 1 cm in size.  We fortunately were able to call and get an appointment within a week and his dermatologist Dr. Juel Burrow office.  Pain of right upper extremity, shoulder pain-no significant improvement with prednisone or gabapentin.  I did show him some rotator cuff exercises to do.  Continue ibuprofen for now, referral to orthopedics for further evaluation.  Consider rotator cuff tendinitis versus other rotator cuff pathology, arthritis or other.  Last visit he had some symptoms of shoulder bursitis but that seem to have improved  Kraig was seen today for place on bottom.  Diagnoses and all orders for this visit:  Skin lesion  Chronic right shoulder pain -     AMB referral to orthopedics  Pain of right upper extremity -     AMB referral to orthopedics  Follow up: pending referral

## 2022-08-06 ENCOUNTER — Ambulatory Visit (HOSPITAL_COMMUNITY)
Admission: EM | Admit: 2022-08-06 | Discharge: 2022-08-06 | Disposition: A | Discharge status: Home / Self Care | Payer: BC Managed Care – PPO | Attending: Emergency Medicine | Admitting: Emergency Medicine

## 2022-08-06 ENCOUNTER — Encounter (HOSPITAL_COMMUNITY): Payer: Self-pay

## 2022-08-06 DIAGNOSIS — S39012A Strain of muscle, fascia and tendon of lower back, initial encounter: Secondary | ICD-10-CM

## 2022-08-06 MED ORDER — METHOCARBAMOL 500 MG PO TABS: 500.0000 mg | ORAL_TABLET | Freq: Two times a day (BID) | ORAL | 0 refills | Status: DC

## 2022-08-06 MED ORDER — KETOROLAC TROMETHAMINE 60 MG/2ML IM SOLN
INTRAMUSCULAR | Status: AC
  Filled 2022-08-06: qty 2

## 2022-08-06 MED ORDER — IBUPROFEN 600 MG PO TABS: 600.0000 mg | ORAL_TABLET | Freq: Three times a day (TID) | ORAL | 0 refills | Status: AC | PRN

## 2022-08-06 MED ORDER — KETOROLAC TROMETHAMINE 30 MG/ML IJ SOLN
30.0000 mg | Freq: Once | INTRAMUSCULAR | Status: AC
  Administered 2022-08-06: 30 mg via INTRAMUSCULAR

## 2022-08-06 NOTE — ED Provider Notes (Addendum)
[place]    CSN: [id number] Arrival date & time: 08/06/22  4315      History   Chief Complaint Chief Complaint  Patient presents with   Back Pain    HPI Charles Rojas. is a 52 y.o. male.  Patient presents complaining of right lower back pain that started 2 days ago.  Patient denies any fall or trauma upon onset of symptoms.  Patient denies any numbness or tingling in any of his extremities.  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  Patient reports a history of similar symptoms where he used a strained muscle in his back, he states that it usually resolves within a few days, he states that this episode is more painful than usual.  Patient reports that he has taken cyclobenzaprine and used Tiger balm on the area with no relief of symptoms.  Patient reports that he has a history of nephrolithiasis, he states that this feels different and usually his nephrolithiasis symptoms will flareup on the left side in the past.   Patient denies any shortness of breath, chest pain, palpitations, leg swelling, fever, chills, urinary symptoms, headache, tremors, incontinence of bowel, incontinence of bladder, abdominal pain, nausea, vomiting, or any systemic symptoms.   Back Pain   Past Medical History:  Diagnosis Date   Erectile dysfunction    Family history of prostate cancer    GERD (gastroesophageal reflux disease)    H/O oral surgery    Herpes    genital   Hyperlipidemia    Hypertension    Impaired fasting blood sugar    Insomnia    Insulin resistance    Low testosterone in male    Obesity    OSA (obstructive sleep apnea) 07/2017   Renal stone 2019    Patient Active Problem List   Diagnosis Date Noted   Low testosterone 08/11/2021   Need for COVID-19 vaccine 08/11/2021   Hyperlipidemia 12/27/2019   Low testosterone in male  11/02/2019   Gastroesophageal reflux disease 11/02/2019   Insulin resistance 09/14/2018   Impaired fasting blood sugar 09/14/2018   Keloid 09/14/2018   OSA (obstructive sleep apnea) 08/23/2017   Screen for colon cancer 07/06/2017   Essential hypertension, benign 07/06/2017   Snoring 07/06/2017   Chronic pain of left knee 01/17/2017   Screening for prostate cancer 02/12/2016   Obesity 02/12/2016   Family history of prostate cancer in father 02/12/2016   Encounter for health maintenance examination in adult 02/12/2016   Genital herpes 02/12/2016   Insomnia 02/12/2016   Erectile dysfunction 02/12/2016   Skin tag 02/12/2016    Past Surgical History:  Procedure Laterality Date   APPENDECTOMY     COLONOSCOPY  2020   multiple polyps; Dr. Juanita Craver   MOUTH SURGERY         Home Medications    Prior to Admission medications   Medication Sig Start Date End Date Taking? Authorizing Provider  amlodipine-olmesartan (AZOR) 10-20 MG tablet Take 1 tablet by mouth daily. 11/23/21  Yes Tysinger, Camelia Eng, PA-C  aspirin EC 81 MG tablet Take 1 tablet (81 mg total) by mouth daily. 11/23/21  Yes Tysinger, Camelia Eng, PA-C  ibuprofen (ADVIL) 600 MG tablet Take 1 tablet (600 mg total) by mouth every 8 (eight) hours as needed. 08/06/22  Yes Flossie Dibble, NP  methocarbamol (ROBAXIN)  500 MG tablet Take 1 tablet (500 mg total) by mouth 2 (two) times daily. 08/06/22  Yes Flossie Dibble, NP  rosuvastatin (CRESTOR) 10 MG tablet Take 1 tablet (10 mg total) by mouth at bedtime. 11/23/21 11/23/22 Yes Tysinger, Camelia Eng, PA-C  tadalafil (CIALIS) 20 MG tablet Take 1 tablet (20 mg total) by mouth daily as needed for erectile dysfunction. 11/23/21  Yes Tysinger, Camelia Eng, PA-C  temazepam (RESTORIL) 15 MG capsule Take 1 capsule (15 mg total) by mouth at bedtime as needed for sleep. 11/23/21  Yes Tysinger, Camelia Eng, PA-C  valACYclovir (VALTREX) 1000 MG tablet TAKE 2 TABLETS BY MOUTH TWICE A DAY FOR 1 DAY FOR FLARE  UP 12/21/21  Yes Tysinger, Camelia Eng, PA-C  gabapentin (NEURONTIN) 100 MG capsule Take 1 capsule (100 mg total) by mouth 2 (two) times daily. 05/27/22   Tysinger, Camelia Eng, PA-C    Family History Family History  Problem Relation Age of Onset   Cancer Father        prostate, age 48   Hyperlipidemia Father    Diabetes Mother    Hypertension Mother    Osteoarthritis Mother        Knees   Hyperlipidemia Sister    Hypertension Sister    Cancer Paternal Uncle        prostate   Cancer Maternal Uncle        thyroid   Heart disease Neg Hx    Stroke Neg Hx     Social History Social History   Tobacco Use   Smoking status: Never   Smokeless tobacco: Never  Vaping Use   Vaping Use: Never used  Substance Use Topics   Alcohol use: Yes    Comment: rarely   Drug use: No     Allergies   Patient has no known allergies.   Review of Systems Review of Systems  Musculoskeletal:  Positive for back pain.   Per HPI  Physical Exam Triage Vital Signs ED Triage Vitals  Enc Vitals Group     BP 08/06/22 0821 132/86     Pulse Rate 08/06/22 0821 (!) 101     Resp 08/06/22 0821 16     Temp 08/06/22 0821 98.1 F (36.7 C)     Temp Source 08/06/22 0821 Oral     SpO2 08/06/22 0821 94 %     Weight 08/06/22 0820 245 lb (111.1 kg)     Height 08/06/22 0820 6' (1.829 m)     Head Circumference --      Peak Flow --      Pain Score 08/06/22 0820 8     Pain Loc --      Pain Edu? --      Excl. in [place]? --    No data found.  Updated Vital Signs BP 132/86 (BP Location: Left Arm)   Pulse (!) 101   Temp 98.1 F (36.7 C) (Oral)   Resp 16   Ht 6' (1.829 m)   Wt 245 lb (111.1 kg)   SpO2 94%   BMI 33.23 kg/m      Physical Exam Vitals and nursing note reviewed.  Constitutional:      Appearance: Normal appearance.  Cardiovascular:     Rate and Rhythm: Normal rate and regular rhythm.     Heart sounds: Normal heart sounds, S1 normal and S2 normal.  Pulmonary:     Effort: Pulmonary effort is  normal.     Breath sounds: Normal breath sounds and  air entry. No decreased breath sounds, wheezing or rhonchi.  Musculoskeletal:     Lumbar back: Tenderness (Right lower back) present. No swelling, edema, deformity, signs of trauma, lacerations, spasms or bony tenderness. Decreased range of motion. Negative right straight leg raise test and negative left straight leg raise test. No scoliosis.       Back:     Comments: Reproducible pain with bending and raising RT leg while in a supine position causes pain in area located on diagram.   No erythema, no rash, no bruising, no ecchymosis, and no changes to skin on Lumbar area.     Neurological:     Mental Status: He is alert.      UC Treatments / Results  Labs (all labs ordered are listed, but only abnormal results are displayed) Labs Reviewed - No data to display  EKG   Radiology No results found.  Procedures Procedures (including critical care time)  Medications Ordered in UC Medications  ketorolac (TORADOL) 30 MG/ML injection 30 mg (30 mg Intramuscular Given 08/06/22 0851)    Initial Impression / Assessment and Plan / UC Course  I have reviewed the triage vital signs and the nursing notes.  Pertinent labs & imaging results that were available during my care of the patient were reviewed by me and considered in my medical decision making (see chart for details).     Patient was evaluated for strain of lumbar region.  Toradol injection given in office provided some relief of symptoms.  Robaxin and ibuprofen prescription sent to the pharmacy.  He was made aware of safety precautions with muscle relaxant.  Patient was made aware of treatment regiment.  Patient was made aware of timeline for symptom resolution and when follow-up is necessary.  Patient was made aware of red flag symptoms that would warrant an emergency department visit.  Patient verbalized understanding of instructions.  Charting was provided using a a verbal  dictation system, charting was proofread for errors, errors may occur which could change the meaning of the information charted.   Final Clinical Impressions(s) / UC Diagnoses   Final diagnoses:  Strain of lumbar region, initial encounter     Discharge Instructions      We gave you a Toradol injection in office today.  Robaxin is a muscle relaxant, you may take this medication 2 times daily as needed, this medication can make you sleepy, please do not operate any heavy machinery or drive a car after taking this medicine.   Ibuprofen 800 mg has been sent to the pharmacy, you may use this every 8 hours as needed, please put food on your stomach with taking this medication, you do not need to start taking this medication till tomorrow since we gave you the anti-inflammatory injection in office today.  As discussed, you may place a heat pad on the area and do some stretch exercises at home.   If your symptoms or not improving, you may follow-up with this office.  If you develop any severe/concerning symptoms please go to the nearest emergency department for further evaluation       ED Prescriptions     Medication Sig Dispense Auth. Provider   methocarbamol (ROBAXIN) 500 MG tablet Take 1 tablet (500 mg total) by mouth 2 (two) times daily. 20 tablet Flossie Dibble, NP   ibuprofen (ADVIL) 600 MG tablet Take 1 tablet (600 mg total) by mouth every 8 (eight) hours as needed. 30 tablet Flossie Dibble, NP  PDMP not reviewed this encounter.   Flossie Dibble, NP 08/06/22 0955    Flossie Dibble, NP 08/06/22 [phone]

## 2022-08-06 NOTE — Discharge Instructions (Addendum)
We gave you a Toradol injection in office today.  Robaxin is a muscle relaxant, you may take this medication 2 times daily as needed, this medication can make you sleepy, please do not operate any heavy machinery or drive a car after taking this medicine.   Ibuprofen 800 mg has been sent to the pharmacy, you may use this every 8 hours as needed, please put food on your stomach with taking this medication, you do not need to start taking this medication till tomorrow since we gave you the anti-inflammatory injection in office today.  As discussed, you may place a heat pad on the area and do some stretch exercises at home.   If your symptoms or not improving, you may follow-up with this office.  If you develop any severe/concerning symptoms please go to the nearest emergency department for further evaluation

## 2022-08-06 NOTE — ED Triage Notes (Signed)
Chief Complaint: back pain, no falls or known injuries.   Onset: Wednesday   Prescriptions or OTC medications tried: Yes- muscle relaxer     with no relief

## 2022-08-09 ENCOUNTER — Ambulatory Visit: Payer: BC Managed Care – PPO | Admitting: Medical

## 2022-08-09 ENCOUNTER — Encounter: Payer: Self-pay | Admitting: Medical

## 2022-08-09 VITALS — BP 128/70 | HR 110 | Wt 249.2 lb

## 2022-08-09 DIAGNOSIS — M545 Low back pain, unspecified: Secondary | ICD-10-CM

## 2022-08-09 DIAGNOSIS — M62830 Muscle spasm of back: Secondary | ICD-10-CM | POA: Diagnosis not present

## 2022-08-09 MED ORDER — TIZANIDINE HCL 4 MG PO TABS: 4.0000 mg | ORAL_TABLET | Freq: Every day | ORAL | 0 refills | Status: DC

## 2022-08-09 MED ORDER — HYDROCODONE-ACETAMINOPHEN 5-325 MG PO TABS: 1.0000 | ORAL_TABLET | Freq: Four times a day (QID) | ORAL | 0 refills | Status: DC | PRN

## 2022-08-09 NOTE — Progress Notes (Signed)
Subjective:  Charles Rojas. is a 52 y.o. male who presents for Chief Complaint  Patient presents with   lower back pain    Lower back pain-since last week. Not sure what happen. Went to UC and was given shot and muscle relaxer but no relief     Here for about a week of back pain.  Went to urgent care 3 days ago.  Had shot for pain and was prescribed muscle relaxers.   Usually this treatment would help given prior issues with back but this time symptoms linger.   Has been back to work whereas in the past would take a few days off of work.  Sitting or lying back tightens.    Pain mainly in low back.  Is some improved from last week.  Mostly right side currently.   No pain down legs, no numbness or tingling, no fever, no blood in stool or urine, no saddle anesthesia.   Using some ibuprofen '600mg'$  some, also prescribed by urgent care.   No recent injury or trauma.   Works as a Dealer.     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   No prior PT.   No other aggravating or relieving factors.    No other c/o.  The following portions of the patient's history were reviewed and updated as appropriate: allergies, current medications, past family history, past medical history, past social history, past surgical history and problem list.  ROS Otherwise as in subjective above  Objective: BP 128/70   Pulse (!) 110   Wt 249 lb 3.2 oz (113 kg)   BMI 33.80 kg/m   General appearance: alert, no distress, well developed, well nourished Back: tender right paraspinal lumbar region, ROM quite limited, no swelling or deformity otherwise Legs nontender, normal ROM Legs neurovascularly intact, but + SLR right at 15 degrees Pulses: 2+ radial pulses, 2+ pedal pulses, normal cap refill Ext: no edema   Assessment: Encounter Diagnoses  Name Primary?   Acute right-sided low back pain without sciatica Yes   Back spasm      Plan: Discussed symptoms and concerns.  Change to tizanidine, stop  Robaxin as he does not like is helping.  I gave the short-term prescription for hydrocodone for worse pain breakthrough pain.  Continue ibuprofen.  Advise stretching, relative rest, avoiding prolonged sitting or standing.  He will take a few days off work.  He plans to see a chiropractor in [place], recommended by his brother.  I suspect his current symptoms were improved over the next 4 to 5 days as long as he avoids reinjury.  I reviewed his CT renal study he had in 2021.  At that time he has some mild degenerative changes in the spine he also had a bone spur at anterior superior endplate of L4 that could be an area of concern in the future   [place] was seen today for lower back pain.  Diagnoses and all orders for this visit:  Acute right-sided low back pain without sciatica  Back spasm  Other orders -     HYDROcodone-acetaminophen (NORCO) 5-325 MG tablet; Take 1 tablet by mouth every 6 (six) hours as needed. -     tiZANidine (ZANAFLEX) 4 MG tablet; Take 1 tablet (4 mg total) by mouth at bedtime.    Follow up: prn

## 2022-08-12 ENCOUNTER — Encounter: Payer: BC Managed Care – PPO | Admitting: Medical

## 2022-10-01 ENCOUNTER — Telehealth: Payer: Self-pay

## 2022-10-01 NOTE — Telephone Encounter (Signed)
Pt called to schedule an appt with [facility] for sinus problems, mild tightness of the throat - no coughing or fever. I advised him Audelia Acton is out and offered an appt with another provider on Monday. He declined and stated he would try OTC meds.

## 2022-10-27 ENCOUNTER — Other Ambulatory Visit: Payer: Self-pay | Admitting: Medical

## 2022-10-27 NOTE — Telephone Encounter (Signed)
Looks like this was given back in 2021

## 2022-11-09 ENCOUNTER — Other Ambulatory Visit: Payer: Self-pay | Admitting: Medical

## 2022-11-09 ENCOUNTER — Telehealth: Payer: Self-pay | Admitting: Medical

## 2022-11-09 NOTE — Telephone Encounter (Signed)
Last refilled in 2021. Ok to refill?

## 2022-11-09 NOTE — Telephone Encounter (Signed)
Pt called and was wondering why his request for  Sildenafil Citrate 20 MG was denied, he says he normally takes it when needed.

## 2022-11-10 MED ORDER — TADALAFIL 20 MG PO TABS: 20.0000 mg | ORAL_TABLET | Freq: Every day | ORAL | 1 refills | Status: DC | PRN

## 2022-11-10 NOTE — Telephone Encounter (Signed)
Left message for pt to call me back 

## 2022-11-10 NOTE — Telephone Encounter (Signed)
Pt needed a refill on the Tadaliafil and not siladenfil

## 2022-11-23 ENCOUNTER — Ambulatory Visit: Payer: BC Managed Care – PPO | Admitting: Medical

## 2022-11-23 ENCOUNTER — Encounter: Payer: Self-pay | Admitting: Medical

## 2022-11-23 VITALS — BP 120/80 | HR 88 | Ht 73.0 in | Wt 247.2 lb

## 2022-11-23 DIAGNOSIS — Z8042 Family history of malignant neoplasm of prostate: Secondary | ICD-10-CM

## 2022-11-23 DIAGNOSIS — N529 Male erectile dysfunction, unspecified: Secondary | ICD-10-CM

## 2022-11-23 DIAGNOSIS — G4733 Obstructive sleep apnea (adult) (pediatric): Secondary | ICD-10-CM

## 2022-11-23 DIAGNOSIS — R7301 Impaired fasting glucose: Secondary | ICD-10-CM | POA: Diagnosis not present

## 2022-11-23 DIAGNOSIS — L910 Hypertrophic scar: Secondary | ICD-10-CM

## 2022-11-23 DIAGNOSIS — A6000 Herpesviral infection of urogenital system, unspecified: Secondary | ICD-10-CM

## 2022-11-23 DIAGNOSIS — K219 Gastro-esophageal reflux disease without esophagitis: Secondary | ICD-10-CM | POA: Diagnosis not present

## 2022-11-23 DIAGNOSIS — I10 Essential (primary) hypertension: Secondary | ICD-10-CM

## 2022-11-23 DIAGNOSIS — R7989 Other specified abnormal findings of blood chemistry: Secondary | ICD-10-CM

## 2022-11-23 DIAGNOSIS — E785 Hyperlipidemia, unspecified: Secondary | ICD-10-CM

## 2022-11-23 DIAGNOSIS — G4700 Insomnia, unspecified: Secondary | ICD-10-CM

## 2022-11-23 DIAGNOSIS — Z125 Encounter for screening for malignant neoplasm of prostate: Secondary | ICD-10-CM

## 2022-11-23 DIAGNOSIS — Z0000 Encounter for general adult medical examination without abnormal findings: Secondary | ICD-10-CM

## 2022-11-23 MED ORDER — ROSUVASTATIN CALCIUM 10 MG PO TABS: 10.0000 mg | ORAL_TABLET | Freq: Every day | ORAL | 3 refills | Status: DC

## 2022-11-23 MED ORDER — AMLODIPINE-OLMESARTAN 10-20 MG PO TABS: 1.0000 | ORAL_TABLET | Freq: Every day | ORAL | 3 refills | Status: DC

## 2022-11-23 MED ORDER — TADALAFIL 20 MG PO TABS: 20.0000 mg | ORAL_TABLET | Freq: Every day | ORAL | 2 refills | Status: DC | PRN

## 2022-11-23 MED ORDER — TEMAZEPAM 15 MG PO CAPS: 15.0000 mg | ORAL_CAPSULE | Freq: Every evening | ORAL | 1 refills | Status: DC | PRN

## 2022-11-23 MED ORDER — GABAPENTIN 100 MG PO CAPS: 100.0000 mg | ORAL_CAPSULE | Freq: Two times a day (BID) | ORAL | 2 refills | Status: DC

## 2022-11-23 MED ORDER — VALACYCLOVIR HCL 1 G PO TABS: ORAL_TABLET | ORAL | 2 refills | Status: DC

## 2022-11-23 NOTE — Progress Notes (Signed)
Subjective:   HPI  Charles Rojas. is a 52 y.o. male who presents for Chief Complaint  Patient presents with   fasting cpe    Fasting cpe, no concerns    Patient Care Team: Venita Seng, Kermit Balo, PA-C as PCP - General (Family Medicine) Sees dentist Sees eye doctor Dr. Charna Elizabeth, GI Dr. Margo Aye, dermatology   Concerns: Hypertension-compliant with medication without complaint  Hyperlipidemia-compliant with medication without complaint, but ran out weeks ago  Seeing dermatology, has numerous skin tags, had a few recently frozen and cut off, but those keloid up.  Has longstanding keloid of chest and umbilicus.   Is going back soon to discuss steroid injection.  Reviewed their medical, surgical, family, social, medication, and allergy history and updated chart as appropriate.  Past Medical History:  Diagnosis Date   Erectile dysfunction    Family history of prostate cancer    GERD (gastroesophageal reflux disease)    H/O oral surgery    Herpes    genital   Hyperlipidemia    Hypertension    Impaired fasting blood sugar    Insomnia    Insulin resistance    Low testosterone in male    Obesity    OSA (obstructive sleep apnea) 07/2017   Renal stone 2019    Past Surgical History:  Procedure Laterality Date   APPENDECTOMY     COLONOSCOPY  2020   multiple polyps; Dr. Charna Elizabeth   MOUTH SURGERY      Family History  Problem Relation Age of Onset   Cancer Father        prostate, age 63   Hyperlipidemia Father    Diabetes Mother    Hypertension Mother    Osteoarthritis Mother        Knees   Hyperlipidemia Sister    Hypertension Sister    Cancer Paternal Uncle        prostate   Cancer Maternal Uncle        thyroid   Heart disease Neg Hx    Stroke Neg Hx      Current Outpatient Medications:    HYDROcodone-acetaminophen (NORCO) 5-325 MG tablet, Take 1 tablet by mouth every 6 (six) hours as needed., Disp: 12 tablet, Rfl: 0   ibuprofen (ADVIL) 600 MG tablet, Take  1 tablet (600 mg total) by mouth every 8 (eight) hours as needed., Disp: 30 tablet, Rfl: 0   methocarbamol (ROBAXIN) 500 MG tablet, Take 1 tablet (500 mg total) by mouth 2 (two) times daily., Disp: 20 tablet, Rfl: 0   tiZANidine (ZANAFLEX) 4 MG tablet, Take 1 tablet (4 mg total) by mouth at bedtime., Disp: 20 tablet, Rfl: 0   amlodipine-olmesartan (AZOR) 10-20 MG tablet, Take 1 tablet by mouth daily., Disp: 90 tablet, Rfl: 3   gabapentin (NEURONTIN) 100 MG capsule, Take 1 capsule (100 mg total) by mouth 2 (two) times daily., Disp: 180 capsule, Rfl: 2   rosuvastatin (CRESTOR) 10 MG tablet, Take 1 tablet (10 mg total) by mouth at bedtime., Disp: 90 tablet, Rfl: 3   tadalafil (CIALIS) 20 MG tablet, Take 1 tablet (20 mg total) by mouth daily as needed for erectile dysfunction., Disp: 30 tablet, Rfl: 2   temazepam (RESTORIL) 15 MG capsule, Take 1 capsule (15 mg total) by mouth at bedtime as needed for sleep., Disp: 90 capsule, Rfl: 1   valACYclovir (VALTREX) 1000 MG tablet, 2 tablets po x 2 days for flare up, Disp: 40 tablet, Rfl: 2  No Known Allergies  Review of Systems  Constitutional:  Negative for chills, fever, malaise/fatigue and weight loss.  HENT:  Negative for congestion, ear pain, hearing loss, sore throat and tinnitus.   Eyes:  Negative for blurred vision, pain and redness.  Respiratory:  Negative for cough, hemoptysis and shortness of breath.   Cardiovascular:  Negative for chest pain, palpitations, orthopnea, claudication and leg swelling.  Gastrointestinal:  Negative for abdominal pain, blood in stool, constipation, diarrhea, nausea and vomiting.  Genitourinary:  Negative for dysuria, flank pain, frequency, hematuria and urgency.  Musculoskeletal:  Positive for joint pain. Negative for falls and myalgias.  Skin:  Negative for itching and rash.  Neurological:  Negative for dizziness, tingling, speech change, weakness and headaches.  Endo/Heme/Allergies:  Negative for polydipsia. Does  not bruise/bleed easily.  Psychiatric/Behavioral:  Negative for depression and memory loss. The patient is not nervous/anxious and does not have insomnia.         11/23/2022    8:17 AM 07/07/2022   10:46 AM 05/27/2022   10:02 AM 11/20/2021   10:33 AM 08/11/2021   10:17 AM  Depression screen PHQ 2/9  Decreased Interest 0 0 0 0 0  Down, Depressed, Hopeless 0 0 0 0 0  PHQ - 2 Score 0 0 0 0 0  Altered sleeping  0     Tired, decreased energy  0     Change in appetite  0     Feeling bad or failure about yourself   0     Trouble concentrating  0     Moving slowly or fidgety/restless  0     Suicidal thoughts  0     PHQ-9 Score  0     Difficult doing work/chores  Not difficult at all           Objective:  BP 120/80   Pulse 88   Ht 6\' 1"  (1.854 m)   Wt 247 lb 3.2 oz (112.1 kg)   BMI 32.61 kg/m   Wt Readings from Last 3 Encounters:  11/23/22 247 lb 3.2 oz (112.1 kg)  08/09/22 249 lb 3.2 oz (113 kg)  08/06/22 245 lb (111.1 kg)   BP Readings from Last 3 Encounters:  11/23/22 120/80  08/09/22 128/70  08/06/22 132/86   General appearance: alert, no distress, WD/WN, African American male Skin: Large keloid horizontally across the chest, keloid of the umbilicus, numerous skin tags of the anterior and left and right anterior neck HEENT: normocephalic, conjunctiva/corneas normal, sclerae anicteric, PERRLA, EOMi, nares patent, no discharge or erythema, pharynx normal Oral cavity: MMM, tongue normal, teeth normal Neck: supple, no lymphadenopathy, no thyromegaly, no masses, normal ROM, no bruits Chest: non tender, normal shape and expansion Heart: RRR, normal S1, S2, no murmurs Lungs: CTA bilaterally, no wheezes, rhonchi, or rales Abdomen: +bs, soft, non tender, non distended, no masses, no hepatomegaly, no splenomegaly, no bruits Back: non tender, normal ROM, no scoliosis Musculoskeletal: upper extremities non tender, no obvious deformity, normal ROM throughout, lower extremities non  tender, no obvious deformity, normal ROM throughout Extremities: no edema, no cyanosis, no clubbing Pulses: 2+ symmetric, upper and lower extremities, normal cap refill Neurological: alert, oriented x 3, CN2-12 intact, strength normal upper extremities and lower extremities, sensation normal throughout, DTRs 2+ throughout, no cerebellar signs, gait normal Psychiatric: normal affect, behavior normal, pleasant  GU: normal male external genitalia,circumcised, nontender, right superior spermatocele superior to testicle, otherwise no masses, no hernia, no lymphadenopathy Rectal: anus normal tone, prostate mildly enlarged   Assessment  and Plan :   Encounter Diagnoses  Name Primary?   Encounter for health maintenance examination in adult Yes   Essential hypertension, benign    Gastroesophageal reflux disease, unspecified whether esophagitis present    Impaired fasting blood sugar    Screening for prostate cancer    Hyperlipidemia, unspecified hyperlipidemia type    Genital herpes simplex, unspecified site    Family history of prostate cancer in father    Erectile dysfunction, unspecified erectile dysfunction type    Insomnia, unspecified type    Low testosterone    Keloid    OSA (obstructive sleep apnea)     This visit was a preventative care visit, also known as wellness visit or routine physical.   Topics typically include healthy lifestyle, diet, exercise, preventative care, vaccinations, sick and well care, proper use of emergency dept and after hours care, as well as other concerns.     Recommendations: Continue to return yearly for your annual wellness and preventative care visits.  This gives [place] a chance to discuss healthy lifestyle, exercise, vaccinations, review your chart record, and perform screenings where appropriate.  I recommend you see your eye doctor yearly for routine vision care.  I recommend you see your dentist yearly for routine dental care including hygiene visits  twice yearly.   Vaccination recommendations were reviewed Immunization History  Administered Date(s) Administered   COVID-19, mRNA, vaccine(Comirnaty)12 years and older 05/27/2022   Moderna Sars-Covid-2 Vaccination 06/15/2020   PFIZER(Purple Top)SARS-COV-2 Vaccination 09/07/2019, 09/28/2019, 12/04/2020   Pfizer Covid-19 Vaccine Bivalent Booster 84yrs & up 08/11/2021   Td 04/14/1995   Tdap 12/19/2013   Zoster Recombinat (Shingrix) 12/04/2020, 03/07/2021    Screening for cancer: Colon cancer screening: I reviewed your colonoscopy on file that is up to date from 2020  We discussed PSA, prostate exam, and prostate cancer screening risks/benefits.     Skin cancer screening: Check your skin regularly for new changes, growing lesions, or other lesions of concern Come in for evaluation if you have skin lesions of concern.  Lung cancer screening: If you have a greater than 20 pack year history of tobacco use, then you may qualify for lung cancer screening with a chest CT scan.   Please call your insurance company to inquire about coverage for this test.  We currently don't have screenings for other cancers besides breast, cervical, colon, and lung cancers.  If you have a strong family history of cancer or have other cancer screening concerns, please let me know.    Bone health: Get at least 150 minutes of aerobic exercise weekly Get weight bearing exercise at least once weekly Bone density test:  A bone density test is an imaging test that uses a type of X-ray to measure the amount of calcium and other minerals in your bones. The test may be used to diagnose or screen you for a condition that causes weak or thin bones (osteoporosis), predict your risk for a broken bone (fracture), or determine how well your osteoporosis treatment is working. The bone density test is recommended for females 65 and older, or females or males <65 if certain risk factors such as thyroid disease, long term use  of steroids such as for asthma or rheumatological issues, vitamin D deficiency, estrogen deficiency, family history of osteoporosis, self or family history of fragility fracture in first degree relative.    Heart health: Get at least 150 minutes of aerobic exercise weekly Limit alcohol It is important to maintain a healthy blood pressure  and healthy cholesterol numbers  Heart disease screening: Screening for heart disease includes screening for blood pressure, fasting lipids, glucose/diabetes screening, BMI height to weight ratio, reviewed of smoking status, physical activity, and diet.    Goals include blood pressure 120/80 or less, maintaining a healthy lipid/cholesterol profile, preventing diabetes or keeping diabetes numbers under good control, not smoking or using tobacco products, exercising most days per week or at least 150 minutes per week of exercise, and eating healthy variety of fruits and vegetables, healthy oils, and avoiding unhealthy food choices like fried food, fast food, high sugar and high cholesterol foods.     Medical care options: I recommend you continue to seek care here first for routine care.  We try really hard to have available appointments Monday through Friday daytime hours for sick visits, acute visits, and physicals.  Urgent care should be used for after hours and weekends for significant issues that cannot wait till the next day.  The emergency department should be used for significant potentially life-threatening emergencies.  The emergency department is expensive, can often have long wait times for less significant concerns, so try to utilize primary care, urgent care, or telemedicine when possible to avoid unnecessary trips to the emergency department.  Virtual visits and telemedicine have been introduced since the pandemic started in 2020, and can be convenient ways to receive medical care.  We offer virtual appointments as well to assist you in a variety of  options to seek medical care.    Separate significant issues discussed: Hypertension-continue current medication, labs today  Hyperlipidemia- continue statin, labs today  Impaired glucose- labs today  Insomnia-doing ok current therapy  Muscle spasm - uses muscle relaxer prn  Genital herpes - uses valtrex prn   Askari was seen today for fasting cpe.  Diagnoses and all orders for this visit:  Encounter for health maintenance examination in adult -     Comprehensive metabolic panel -     CBC -     Lipid panel -     PSA -     Hemoglobin A1c -     Testosterone  Essential hypertension, benign  Gastroesophageal reflux disease, unspecified whether esophagitis present  Impaired fasting blood sugar -     Hemoglobin A1c  Screening for prostate cancer -     PSA  Hyperlipidemia, unspecified hyperlipidemia type -     Lipid panel  Genital herpes simplex, unspecified site  Family history of prostate cancer in father  Erectile dysfunction, unspecified erectile dysfunction type  Insomnia, unspecified type  Low testosterone -     Testosterone  Keloid  OSA (obstructive sleep apnea)  Other orders -     amlodipine-olmesartan (AZOR) 10-20 MG tablet; Take 1 tablet by mouth daily. -     valACYclovir (VALTREX) 1000 MG tablet; 2 tablets po x 2 days for flare up -     temazepam (RESTORIL) 15 MG capsule; Take 1 capsule (15 mg total) by mouth at bedtime as needed for sleep. -     tadalafil (CIALIS) 20 MG tablet; Take 1 tablet (20 mg total) by mouth daily as needed for erectile dysfunction. -     rosuvastatin (CRESTOR) 10 MG tablet; Take 1 tablet (10 mg total) by mouth at bedtime. -     gabapentin (NEURONTIN) 100 MG capsule; Take 1 capsule (100 mg total) by mouth 2 (two) times daily.    Follow-up pending labs, yearly for physical

## 2022-11-23 NOTE — Patient Instructions (Signed)
This visit was a preventative care visit, also known as wellness visit or routine physical.   Topics typically include healthy lifestyle, diet, exercise, preventative care, vaccinations, sick and well care, proper use of emergency dept and after hours care, as well as other concerns.     Recommendations: Continue to return yearly for your annual wellness and preventative care visits.  This gives [place] a chance to discuss healthy lifestyle, exercise, vaccinations, review your chart record, and perform screenings where appropriate.  I recommend you see your eye doctor yearly for routine vision care.  I recommend you see your dentist yearly for routine dental care including hygiene visits twice yearly.   Vaccination recommendations were reviewed Immunization History  Administered Date(s) Administered   COVID-19, mRNA, vaccine(Comirnaty)12 years and older 05/27/2022   Moderna Sars-Covid-2 Vaccination 06/15/2020   PFIZER(Purple Top)SARS-COV-2 Vaccination 09/07/2019, 09/28/2019, 12/04/2020   Pfizer Covid-19 Vaccine Bivalent Booster 3yrs & up 08/11/2021   Td 04/14/1995   Tdap 12/19/2013   Zoster Recombinat (Shingrix) 12/04/2020, 03/07/2021    Screening for cancer: Colon cancer screening: I reviewed your colonoscopy on file that is up to date from 2020  We discussed PSA, prostate exam, and prostate cancer screening risks/benefits.     Skin cancer screening: Check your skin regularly for new changes, growing lesions, or other lesions of concern Come in for evaluation if you have skin lesions of concern.  Lung cancer screening: If you have a greater than 20 pack year history of tobacco use, then you may qualify for lung cancer screening with a chest CT scan.   Please call your insurance company to inquire about coverage for this test.  We currently don't have screenings for other cancers besides breast, cervical, colon, and lung cancers.  If you have a strong family history of cancer or  have other cancer screening concerns, please let me know.    Bone health: Get at least 150 minutes of aerobic exercise weekly Get weight bearing exercise at least once weekly Bone density test:  A bone density test is an imaging test that uses a type of X-ray to measure the amount of calcium and other minerals in your bones. The test may be used to diagnose or screen you for a condition that causes weak or thin bones (osteoporosis), predict your risk for a broken bone (fracture), or determine how well your osteoporosis treatment is working. The bone density test is recommended for females 65 and older, or females or males <65 if certain risk factors such as thyroid disease, long term use of steroids such as for asthma or rheumatological issues, vitamin D deficiency, estrogen deficiency, family history of osteoporosis, self or family history of fragility fracture in first degree relative.    Heart health: Get at least 150 minutes of aerobic exercise weekly Limit alcohol It is important to maintain a healthy blood pressure and healthy cholesterol numbers  Heart disease screening: Screening for heart disease includes screening for blood pressure, fasting lipids, glucose/diabetes screening, BMI height to weight ratio, reviewed of smoking status, physical activity, and diet.    Goals include blood pressure 120/80 or less, maintaining a healthy lipid/cholesterol profile, preventing diabetes or keeping diabetes numbers under good control, not smoking or using tobacco products, exercising most days per week or at least 150 minutes per week of exercise, and eating healthy variety of fruits and vegetables, healthy oils, and avoiding unhealthy food choices like fried food, fast food, high sugar and high cholesterol foods.     Medical care  options: I recommend you continue to seek care here first for routine care.  We try really hard to have available appointments Monday through Friday daytime hours for  sick visits, acute visits, and physicals.  Urgent care should be used for after hours and weekends for significant issues that cannot wait till the next day.  The emergency department should be used for significant potentially life-threatening emergencies.  The emergency department is expensive, can often have long wait times for less significant concerns, so try to utilize primary care, urgent care, or telemedicine when possible to avoid unnecessary trips to the emergency department.  Virtual visits and telemedicine have been introduced since the pandemic started in 2020, and can be convenient ways to receive medical care.  We offer virtual appointments as well to assist you in a variety of options to seek medical care.    Separate significant issues discussed: Hypertension-continue current medication, labs today  Hyperlipidemia- continue statin, labs today  Impaired glucose- labs today  Insomnia-doing ok current therapy  Muscle spasm - uses muscle relaxer prn  Genital herpes - uses valtrex prn

## 2022-11-24 LAB — CBC
Hematocrit: 42.5 % (ref 37.5–51.0)
Hemoglobin: 13.1 g/dL (ref 13.0–17.7)
MCH: 23.8 pg — ABNORMAL LOW (ref 26.6–33.0)
MCHC: 30.8 g/dL — ABNORMAL LOW (ref 31.5–35.7)
MCV: 77 fL — ABNORMAL LOW (ref 79–97)
Platelets: 399 10*3/uL (ref 150–450)
RBC: 5.51 x10E6/uL (ref 4.14–5.80)
RDW: 13.9 % (ref 11.6–15.4)
WBC: 8.1 10*3/uL (ref 3.4–10.8)

## 2022-11-24 LAB — COMPREHENSIVE METABOLIC PANEL
ALT: 19 IU/L (ref 0–44)
AST: 15 IU/L (ref 0–40)
Albumin/Globulin Ratio: 1.6 (ref 1.2–2.2)
Albumin: 4.2 g/dL (ref 3.8–4.9)
Alkaline Phosphatase: 81 IU/L (ref 44–121)
BUN/Creatinine Ratio: 11 (ref 9–20)
BUN: 11 mg/dL (ref 6–24)
Bilirubin Total: 0.4 mg/dL (ref 0.0–1.2)
CO2: 24 mmol/L (ref 20–29)
Calcium: 9.6 mg/dL (ref 8.7–10.2)
Chloride: 102 mmol/L (ref 96–106)
Creatinine, Ser: 0.99 mg/dL (ref 0.76–1.27)
Globulin, Total: 2.7 g/dL (ref 1.5–4.5)
Glucose: 85 mg/dL (ref 70–99)
Potassium: 4.3 mmol/L (ref 3.5–5.2)
Sodium: 141 mmol/L (ref 134–144)
Total Protein: 6.9 g/dL (ref 6.0–8.5)
eGFR: 91 mL/min/{1.73_m2} (ref >59)

## 2022-11-24 LAB — HEMOGLOBIN A1C
Est. average glucose Bld gHb Est-mCnc: 137 mg/dL
Hgb A1c MFr Bld: 6.4 % — ABNORMAL HIGH (ref 4.8–5.6)

## 2022-11-24 LAB — LIPID PANEL
Chol/HDL Ratio: 3.8 ratio (ref 0.0–5.0)
Cholesterol, Total: 172 mg/dL (ref 100–199)
HDL: 45 mg/dL (ref >39)
LDL Chol Calc (NIH): 110 mg/dL — ABNORMAL HIGH (ref 0–99)
Triglycerides: 94 mg/dL (ref 0–149)
VLDL Cholesterol Cal: 17 mg/dL (ref 5–40)

## 2022-11-24 LAB — TESTOSTERONE: Testosterone: 237 ng/dL — ABNORMAL LOW (ref 264–916)

## 2022-11-24 LAB — PSA: Prostate Specific Ag, Serum: 0.9 ng/mL (ref 0.0–4.0)

## 2022-11-24 NOTE — Progress Notes (Signed)
Blood counts ok, diabetes marker 6.4%, liver, kidney and electrolytes normal, prostate lab normal  I recommend you continue your current medications, but really work hard on losing weight, make some healthy change to your eating habits and exercise plan   Prediabetes means you have a higher than normal blood sugar level. It's not high enough to be considered type 2 diabetes yet, but without making some lifestyle changes you are more likely to develop type 2 diabetes.  If you have prediabetes, the long-term damage of diabetes, especially to your heart, blood vessels and kidneys may already be starting. You may not be able to change certain risk factors such as age, race, or family history, but you CAN make changes to your lifestyle, your eating habits, and your activity.  Although diabetes can develop at any age, the risk of prediabetes increases after age 53.  Your risk of prediabetes increases if you have a parent or sibling with type 2 diabetes.   Although it's unclear why, certain people including Black, Hispanic, American [place] and [place] American people, are more likely to develop prediabetes.  Ways to prevent or slow progression to diabetes: Eat healthy foods - Eating red meat and processed meat, and drinking sugar-sweetened beverages, is associated with a higher risk of prediabetes. A diet high in fruits, vegetables, nuts, whole grains and olive oil is associated with a lower risk of prediabetes. Get at least 150 minutes of moderate aerobic physical activity a week, or about 30 minutes on most days of the week.  The less active you are, the greater your risk of prediabetes. Physical activity helps you control your weight, uses up sugar for energy and makes the body use insulin more effectively. Lose excess weight - Being overweight is a primary risk factor for prediabetes. The more fatty tissue you have, especially inside and between the muscle and skin around your abdomen, the more resistant your  cells become to insulin. Waist size. A large waist size can indicate insulin resistance. The risk of insulin resistance goes up for men with waists larger than 40 inches and for women with waists larger than 35 inches. Control your blood pressure and cholesterol.  If your blood pressure is not 130/80 or less, discuss with your provider to help get this under control.  It is ideal to have an HDL good cholesterol number >50 and have a LDL bad cholesterol number <100.   Don't smoke One simple strategy to help you make good food choices and eat appropriate portions sizes is to divide up your plate. These three divisions on your plate promote healthy eating:  One-half: fruit and non starchy vegetables One-quarter: whole grains One-quarter: protein-rich foods, such as legumes, fish or lean meats

## 2022-11-30 ENCOUNTER — Encounter: Payer: Self-pay | Admitting: Medical

## 2023-11-05 ENCOUNTER — Ambulatory Visit
Admission: EM | Admit: 2023-11-05 | Discharge: 2023-11-05 | Disposition: A | Discharge status: Home / Self Care | Attending: Internal Medicine | Admitting: Internal Medicine

## 2023-11-05 DIAGNOSIS — J309 Allergic rhinitis, unspecified: Secondary | ICD-10-CM | POA: Diagnosis not present

## 2023-11-05 DIAGNOSIS — H1013 Acute atopic conjunctivitis, bilateral: Secondary | ICD-10-CM

## 2023-11-05 MED ORDER — FLUTICASONE PROPIONATE 50 MCG/ACT NA SUSP: 1.0000 | Freq: Every day | NASAL | 2 refills | Status: AC

## 2023-11-05 MED ORDER — CETIRIZINE HCL 10 MG PO TABS: 10.0000 mg | ORAL_TABLET | Freq: Every day | ORAL | 1 refills | Status: AC

## 2023-11-05 MED ORDER — OLOPATADINE HCL 0.1 % OP SOLN: 1.0000 [drp] | Freq: Two times a day (BID) | OPHTHALMIC | 12 refills | Status: AC

## 2023-11-05 NOTE — ED Provider Notes (Signed)
 [facility]    CSN: [id number] Arrival date & time: 11/05/23  0805      History   Chief Complaint Chief Complaint  Patient presents with   Nasal Congestion    HPI Charles Rojas. is a 53 y.o. male.   Patient presents to urgent care for evaluation of nasal congestion, watery itchy eyes, sneezing, and slight cough that started 1 week ago.  Nasal congestion is yellow/clear.  Cough is somewhat productive and very minimal.  Eyes are watery, itchy, and irritated.  No redness or swelling to the eyes.  Denies vision changes, dizziness, headache, ear pain, body aches, fever, chills, nausea, vomiting, diarrhea, and rash.   Denies shortness of breath, chest pain, and heart palpitations.No recent sick contacts with similar symptoms.  History of seasonal allergies.  He is currently taking Claritin and Mucinex sporadically.     Past Medical History:  Diagnosis Date   Erectile dysfunction    Family history of prostate cancer    GERD (gastroesophageal reflux disease)    H/O oral surgery    Herpes    genital   Hyperlipidemia    Hypertension    Impaired fasting blood sugar    Insomnia    Insulin resistance    Low testosterone  in male    Obesity    OSA (obstructive sleep apnea) 07/2017   Renal stone 2019    Patient Active Problem List   Diagnosis Date Noted   Low testosterone  08/11/2021   Hyperlipidemia 12/27/2019   Low testosterone  in male 11/02/2019   Gastroesophageal reflux disease 11/02/2019   Insulin resistance 09/14/2018   Impaired fasting blood sugar 09/14/2018   Keloid 09/14/2018   OSA (obstructive sleep apnea) 08/23/2017   Screen for colon cancer 07/06/2017   Essential hypertension, benign 07/06/2017   Chronic pain of left knee 01/17/2017   Screening for prostate cancer 02/12/2016   Obesity 02/12/2016   Family history of prostate cancer in father 02/12/2016   Encounter for health maintenance examination in adult 02/12/2016   Genital herpes 02/12/2016    Insomnia 02/12/2016   Erectile dysfunction 02/12/2016   Skin tag 02/12/2016    Past Surgical History:  Procedure Laterality Date   APPENDECTOMY     COLONOSCOPY  2020   multiple polyps; Dr. Tami Falcon   MOUTH SURGERY         Home Medications    Prior to Admission medications   Medication Sig Start Date End Date Taking? Authorizing Provider  cetirizine (ZYRTEC ALLERGY) 10 MG tablet Take 1 tablet (10 mg total) by mouth daily. 11/05/23  Yes Starlene Eaton, FNP  fluticasone (FLONASE) 50 MCG/ACT nasal spray Place 1 spray into both nostrils daily. 11/05/23  Yes Starlene Eaton, FNP  olopatadine (PATANOL) 0.1 % ophthalmic solution Place 1 drop into both eyes 2 (two) times daily. 11/05/23  Yes Starlene Eaton, FNP  amLODipine  (NORVASC ) 10 MG tablet Take 10 mg by mouth daily.    [provider]  amlodipine -olmesartan  (AZOR ) 10-20 MG tablet Take 1 tablet by mouth daily. 11/23/22   Tysinger, Christiane Cowing, PA-C  gabapentin  (NEURONTIN ) 100 MG capsule Take 1 capsule (100 mg total) by mouth 2 (two) times daily. 11/23/22   Tysinger, Christiane Cowing, PA-C  HYDROcodone -acetaminophen  (NORCO) 5-325 MG tablet Take 1 tablet by mouth every 6 (six) hours as needed. 08/09/22   Tysinger, Christiane Cowing, PA-C  ibuprofen  (ADVIL ) 600 MG tablet Take 1 tablet (600 mg total) by mouth every 8 (eight) hours as needed. 08/06/22  Wedderburn, Ngozi N, NP  methocarbamol  (ROBAXIN ) 500 MG tablet Take 1 tablet (500 mg total) by mouth 2 (two) times daily. 08/06/22   Wedderburn, Ngozi N, NP  rosuvastatin  (CRESTOR ) 10 MG tablet Take 1 tablet (10 mg total) by mouth at bedtime. 11/23/22 11/23/23  Tysinger, Christiane Cowing, PA-C  tadalafil  (CIALIS ) 20 MG tablet Take 1 tablet (20 mg total) by mouth daily as needed for erectile dysfunction. 11/23/22   Tysinger, Christiane Cowing, PA-C  temazepam  (RESTORIL ) 15 MG capsule Take 1 capsule (15 mg total) by mouth at bedtime as needed for sleep. 11/23/22   Tysinger, Christiane Cowing, PA-C  tiZANidine  (ZANAFLEX ) 4 MG  tablet Take 1 tablet (4 mg total) by mouth at bedtime. 08/09/22   Tysinger, Christiane Cowing, PA-C  valACYclovir  (VALTREX ) 1000 MG tablet 2 tablets po x 2 days for flare up 11/23/22   Tysinger, Christiane Cowing, PA-C    Family History Family History  Problem Relation Age of Onset   Cancer Father        prostate, age 31   Hyperlipidemia Father    Diabetes Mother    Hypertension Mother    Osteoarthritis Mother        Knees   Hyperlipidemia Sister    Hypertension Sister    Cancer Paternal Uncle        prostate   Cancer Maternal Uncle        thyroid   Heart disease Neg Hx    Stroke Neg Hx     Social History Social History   Tobacco Use   Smoking status: Never   Smokeless tobacco: Never  Vaping Use   Vaping status: Never Used  Substance Use Topics   Alcohol use: Yes    Comment: rarely   Drug use: No     Allergies   Patient has no known allergies.   Review of Systems Review of Systems Per HPI  Physical Exam Triage Vital Signs ED Triage Vitals  Encounter Vitals Group     BP 11/05/23 0827 (!) 144/96     Systolic BP Percentile --      Diastolic BP Percentile --      Pulse Rate 11/05/23 0827 90     Resp 11/05/23 0827 18     Temp 11/05/23 0827 97.9 F (36.6 C)     Temp Source 11/05/23 0827 Oral     SpO2 11/05/23 0827 95 %     Weight --      Height --      Head Circumference --      Peak Flow --      Pain Score 11/05/23 0826 0     Pain Loc --      Pain Education --      Exclude from Growth Chart --    No data found.  Updated Vital Signs BP (!) 144/96 (BP Location: Left Arm)   Pulse 90   Temp 97.9 F (36.6 C) (Oral)   Resp 18   SpO2 95%   Visual Acuity Right Eye Distance:   Left Eye Distance:   Bilateral Distance:    Right Eye Near:   Left Eye Near:    Bilateral Near:     Physical Exam Vitals and nursing note reviewed.  Constitutional:      Appearance: He is not ill-appearing or toxic-appearing.  HENT:     Head: Normocephalic and atraumatic.     Right Ear:  Hearing, tympanic membrane, ear canal and external ear normal.  Left Ear: Hearing, tympanic membrane, ear canal and external ear normal.     Nose: Congestion present.     Right Turbinates: Swollen and pale.     Left Turbinates: Swollen and pale.     Mouth/Throat:     Lips: Pink.     Mouth: Mucous membranes are moist. No injury or oral lesions.     Dentition: Normal dentition.     Tongue: No lesions.     Pharynx: Oropharynx is clear. Uvula midline. Posterior oropharyngeal erythema present. No pharyngeal swelling, oropharyngeal exudate, uvula swelling or postnasal drip.     Tonsils: No tonsillar exudate.  Eyes:     General: Lids are normal. Vision grossly intact. Gaze aligned appropriately. No allergic shiner.       Right eye: No foreign body, discharge or hordeolum.        Left eye: No foreign body, discharge or hordeolum.     Extraocular Movements: Extraocular movements intact.     Conjunctiva/sclera: Conjunctivae normal.     Right eye: Right conjunctiva is not injected. No chemosis, exudate or hemorrhage.    Left eye: Left conjunctiva is not injected. No chemosis, exudate or hemorrhage. Neck:     Trachea: Trachea and phonation normal.  Cardiovascular:     Rate and Rhythm: Normal rate and regular rhythm.     Heart sounds: Normal heart sounds, S1 normal and S2 normal.  Pulmonary:     Effort: Pulmonary effort is normal. No respiratory distress.     Breath sounds: Normal breath sounds and air entry. No wheezing, rhonchi or rales.  Chest:     Chest wall: No tenderness.  Musculoskeletal:     Cervical back: Neck supple.  Lymphadenopathy:     Cervical: No cervical adenopathy.  Skin:    General: Skin is warm and dry.     Capillary Refill: Capillary refill takes less than 2 seconds.     Findings: No rash.  Neurological:     General: No focal deficit present.     Mental Status: He is alert and oriented to person, place, and time. Mental status is at baseline.     Cranial Nerves: No  dysarthria or facial asymmetry.  Psychiatric:        Mood and Affect: Mood normal.        Speech: Speech normal.        Behavior: Behavior normal.        Thought Content: Thought content normal.        Judgment: Judgment normal.      UC Treatments / Results  Labs (all labs ordered are listed, but only abnormal results are displayed) Labs Reviewed - No data to display  EKG   Radiology No results found.  Procedures Procedures (including critical care time)  Medications Ordered in UC Medications - No data to display  Initial Impression / Assessment and Plan / UC Course  I have reviewed the triage vital signs and the nursing notes.  Pertinent labs & imaging results that were available during my care of the patient were reviewed by me and considered in my medical decision making (see chart for details).   1.  Allergic conjunctivitis of both eyes and rhinitis Symptoms due to allergies.  Low suspicion for viral/bacterial conjunctivitis/etiology of symptoms.  Olopatadine eye drops and zyrtec as directed.  Supportive care/warm compresses discussed.    Counseled patient on potential for adverse effects with medications prescribed/recommended today, strict ER and return-to-clinic precautions discussed, patient verbalized understanding.  Final Clinical Impressions(s) / UC Diagnoses   Final diagnoses:  Allergic conjunctivitis of both eyes and rhinitis     Discharge Instructions      Your symptoms are likely due to environmental allergies.  Avoid exposure to allergens. Take oral antihistamine (Either Zyrtec, Claritin, or Allegra) and use Flonase daily as directed. You can buy these medications over the counter. These medications can take a few days to fully kick in to your body and start working. Return for any new or worsening symptoms.  If your eyes become itchy, you may purchase olopatadine (Pataday) eyedrops over the counter and use as directed to relieve watery/itchy  eyes associated with allergies as well.   If your symptoms are severe, please go to the emergency room for further evaluation. Schedule an appointment with your primary care provider for follow-up and further management of your seasonal allergies as well as ongoing preventive healthcare.      ED Prescriptions     Medication Sig Dispense Auth. Provider   olopatadine (PATANOL) 0.1 % ophthalmic solution Place 1 drop into both eyes 2 (two) times daily. 5 mL Shella Devoid M, FNP   fluticasone [facility]) 50 MCG/ACT nasal spray Place 1 spray into both nostrils daily. 16 g Shella Devoid M, FNP   cetirizine (ZYRTEC ALLERGY) 10 MG tablet Take 1 tablet (10 mg total) by mouth daily. 30 tablet Starlene Eaton, FNP      PDMP not reviewed this encounter.   Shella Devoid [place], [place] 11/05/23 [phone]

## 2023-11-05 NOTE — Discharge Instructions (Signed)
Your symptoms are likely due to environmental allergies.  Avoid exposure to allergens. Take oral antihistamine (Either Zyrtec, Claritin, or Allegra) and use Flonase daily as directed. You can buy these medications over the counter. These medications can take a few days to fully kick in to your body and start working. Return for any new or worsening symptoms.  If your eyes become itchy, you may purchase olopatadine (Pataday) eyedrops over the counter and use as directed to relieve watery/itchy eyes associated with allergies as well.   If your symptoms are severe, please go to the emergency room for further evaluation. Schedule an appointment with your primary care provider for follow-up and further management of your seasonal allergies as well as ongoing preventive healthcare.

## 2023-11-05 NOTE — ED Triage Notes (Signed)
 Pt c/o nasal drainage and congestion (yellow sputum) since Sunday. Has been using OTC meds - Claritin D, mucinex - with little relief.

## 2023-11-24 ENCOUNTER — Ambulatory Visit: Payer: BC Managed Care – PPO | Admitting: Medical

## 2023-11-24 ENCOUNTER — Other Ambulatory Visit: Payer: Self-pay | Admitting: Medical

## 2023-11-24 VITALS — BP 122/70 | HR 90 | Ht 72.0 in | Wt 256.2 lb

## 2023-11-24 DIAGNOSIS — I10 Essential (primary) hypertension: Secondary | ICD-10-CM

## 2023-11-24 DIAGNOSIS — Z0000 Encounter for general adult medical examination without abnormal findings: Secondary | ICD-10-CM

## 2023-11-24 DIAGNOSIS — Z7185 Encounter for immunization safety counseling: Secondary | ICD-10-CM | POA: Diagnosis not present

## 2023-11-24 DIAGNOSIS — Z23 Encounter for immunization: Secondary | ICD-10-CM

## 2023-11-24 DIAGNOSIS — R7301 Impaired fasting glucose: Secondary | ICD-10-CM

## 2023-11-24 DIAGNOSIS — Z1211 Encounter for screening for malignant neoplasm of colon: Secondary | ICD-10-CM

## 2023-11-24 DIAGNOSIS — E785 Hyperlipidemia, unspecified: Secondary | ICD-10-CM

## 2023-11-24 DIAGNOSIS — Z125 Encounter for screening for malignant neoplasm of prostate: Secondary | ICD-10-CM | POA: Diagnosis not present

## 2023-11-24 DIAGNOSIS — Z1389 Encounter for screening for other disorder: Secondary | ICD-10-CM

## 2023-11-24 LAB — LIPID PANEL

## 2023-11-24 MED ORDER — ROSUVASTATIN CALCIUM 10 MG PO TABS: 10.0000 mg | ORAL_TABLET | Freq: Every day | ORAL | 3 refills | Status: DC

## 2023-11-24 MED ORDER — TADALAFIL 20 MG PO TABS: 20.0000 mg | ORAL_TABLET | Freq: Every day | ORAL | 2 refills | Status: AC | PRN

## 2023-11-24 MED ORDER — VALACYCLOVIR HCL 1 G PO TABS: ORAL_TABLET | ORAL | 0 refills | Status: AC

## 2023-11-24 MED ORDER — VALACYCLOVIR HCL 1 G PO TABS: ORAL_TABLET | ORAL | 2 refills | Status: DC

## 2023-11-24 MED ORDER — AMLODIPINE-OLMESARTAN 10-20 MG PO TABS: 1.0000 | ORAL_TABLET | Freq: Every day | ORAL | 3 refills | Status: AC

## 2023-11-24 MED ORDER — TEMAZEPAM 15 MG PO CAPS: 15.0000 mg | ORAL_CAPSULE | Freq: Every evening | ORAL | 1 refills | Status: AC | PRN

## 2023-11-24 NOTE — Progress Notes (Addendum)
 Subjective:   HPI  Charles Rojas. is a 53 y.o. male who presents for Chief Complaint  Patient presents with   Annual Exam    Fasting cpe, no concerns    Patient Care Team: Dima Mini, Christiane Cowing, PA-C as PCP - General (Family Medicine) Sees dentist Sees eye doctor Dr. Tami Falcon, GI Dr. Del Favia, dermatology Elihu Grumet, PA, ortho  Concerns: Hypertension-compliant with medication without complaint  Hyperlipidemia-compliant with medication without complaint, but ran out weeks ago  Insomnia - uses Temazepam  prn, not as regular of late  Allergies - uses flonase , eye drops and zyrtec   No concern for testosterone  treatment , no energy concerns although TST low in past   Reviewed their medical, surgical, family, social, medication, and allergy history and updated chart as appropriate.  Past Medical History:  Diagnosis Date   Erectile dysfunction    Family history of prostate cancer    GERD (gastroesophageal reflux disease)    H/O oral surgery    Herpes    genital   Hyperlipidemia    Hypertension    Impaired fasting blood sugar    Insomnia    Insulin resistance    Low testosterone  in male    Obesity    OSA (obstructive sleep apnea) 07/2017   Renal stone 2019    Past Surgical History:  Procedure Laterality Date   APPENDECTOMY     COLONOSCOPY  2020   multiple polyps; Dr. Tami Falcon   MOUTH SURGERY      Family History  Problem Relation Age of Onset   Cancer Father        prostate, age 52   Hyperlipidemia Father    Diabetes Mother    Hypertension Mother    Osteoarthritis Mother        Knees   Hyperlipidemia Sister    Hypertension Sister    Cancer Paternal Uncle        prostate   Cancer Maternal Uncle        thyroid   Heart disease Neg Hx    Stroke Neg Hx      Current Outpatient Medications:    cetirizine  (ZYRTEC  ALLERGY) 10 MG tablet, Take 1 tablet (10 mg total) by mouth daily., Disp: 30 tablet, Rfl: 1   fluticasone  (FLONASE ) 50 MCG/ACT nasal  spray, Place 1 spray into both nostrils daily., Disp: 16 g, Rfl: 2   ibuprofen  (ADVIL ) 600 MG tablet, Take 1 tablet (600 mg total) by mouth every 8 (eight) hours as needed., Disp: 30 tablet, Rfl: 0   olopatadine  (PATANOL) 0.1 % ophthalmic solution, Place 1 drop into both eyes 2 (two) times daily., Disp: 5 mL, Rfl: 12   amlodipine -olmesartan  (AZOR ) 10-20 MG tablet, Take 1 tablet by mouth daily., Disp: 90 tablet, Rfl: 3   rosuvastatin  (CRESTOR ) 10 MG tablet, Take 1 tablet (10 mg total) by mouth at bedtime., Disp: 90 tablet, Rfl: 3   tadalafil  (CIALIS ) 20 MG tablet, Take 1 tablet (20 mg total) by mouth daily as needed for erectile dysfunction., Disp: 30 tablet, Rfl: 2   temazepam  (RESTORIL ) 15 MG capsule, Take 1 capsule (15 mg total) by mouth at bedtime as needed for sleep., Disp: 90 capsule, Rfl: 1   valACYclovir  (VALTREX ) 1000 MG tablet, 2 tablets po x 2 days for flare up, Disp: 40 tablet, Rfl: 2  No Known Allergies   Review of Systems  Constitutional:  Negative for chills, fever, malaise/fatigue and weight loss.  HENT:  Negative for congestion, ear pain, hearing loss, sore  throat and tinnitus.   Eyes:  Negative for blurred vision, pain and redness.  Respiratory:  Negative for cough, hemoptysis and shortness of breath.   Cardiovascular:  Negative for chest pain, palpitations, orthopnea, claudication and leg swelling.  Gastrointestinal:  Negative for abdominal pain, blood in stool, constipation, diarrhea, nausea and vomiting.  Genitourinary:  Negative for dysuria, flank pain, frequency, hematuria and urgency.  Musculoskeletal:  Negative for falls, joint pain and myalgias.  Skin:  Negative for itching and rash.  Neurological:  Negative for dizziness, tingling, speech change, weakness and headaches.  Endo/Heme/Allergies:  Negative for polydipsia. Does not bruise/bleed easily.  Psychiatric/Behavioral:  Negative for depression and memory loss. The patient is not nervous/anxious and does not have  insomnia.         11/24/2023    8:14 AM 11/23/2022    8:17 AM 07/07/2022   10:46 AM 05/27/2022   10:02 AM 11/20/2021   10:33 AM  Depression screen PHQ 2/9  Decreased Interest 0 0 0 0 0  Down, Depressed, Hopeless 0 0 0 0 0  PHQ - 2 Score 0 0 0 0 0  Altered sleeping   0    Tired, decreased energy   0    Change in appetite   0    Feeling bad or failure about yourself    0    Trouble concentrating   0    Moving slowly or fidgety/restless   0    Suicidal thoughts   0    PHQ-9 Score   0    Difficult doing work/chores   Not difficult at all          Objective:  BP 122/70   Pulse 90   Ht 6' (1.829 m)   Wt 256 lb 3.2 oz (116.2 kg)   BMI 34.75 kg/m   Wt Readings from Last 3 Encounters:  11/24/23 256 lb 3.2 oz (116.2 kg)  11/23/22 247 lb 3.2 oz (112.1 kg)  08/09/22 249 lb 3.2 oz (113 kg)   BP Readings from Last 3 Encounters:  11/24/23 122/70  11/05/23 (!) 144/96  11/23/22 120/80   General appearance: alert, no distress, WD/WN, African American male Skin: Large keloid horizontally across the chest, keloid of the umbilicus, numerous skin tags of the anterior and left and right anterior neck HEENT: normocephalic, conjunctiva/corneas normal, sclerae anicteric, PERRLA, EOMi, nares patent, no discharge or erythema, pharynx normal Oral cavity: MMM, tongue normal, teeth normal Neck: supple, no lymphadenopathy, no thyromegaly, no masses, normal ROM, no bruits Chest: non tender, normal shape and expansion Heart: RRR, normal S1, S2, no murmurs Lungs: CTA bilaterally, no wheezes, rhonchi, or rales Abdomen: +bs, soft, non tender, non distended, no masses, no hepatomegaly, no splenomegaly, no bruits Back: non tender, normal ROM, no scoliosis Musculoskeletal: upper extremities non tender, no obvious deformity, normal ROM throughout, lower extremities non tender, no obvious deformity, normal ROM throughout Extremities: no edema, no cyanosis, no clubbing Pulses: 2+ symmetric, upper and  lower extremities, normal cap refill Neurological: alert, oriented x 3, CN2-12 intact, strength normal upper extremities and lower extremities, sensation normal throughout, DTRs 2+ throughout, no cerebellar signs, gait normal Psychiatric: normal affect, behavior normal, pleasant  GU: normal male external genitalia,circumcised, nontender, right superior spermatocele superior to testicle, otherwise no masses, no hernia, no lymphadenopathy Rectal: declined    Assessment and Plan :   Encounter Diagnoses  Name Primary?   Encounter for health maintenance examination in adult Yes   Essential hypertension, benign  Hyperlipidemia, unspecified hyperlipidemia type    Screening for prostate cancer    Impaired fasting blood sugar    Screening for colon cancer    Vaccine counseling    Screening for hematuria or proteinuria    Need for Tdap vaccination     This visit was a preventative care visit, also known as wellness visit or routine physical.   Topics typically include healthy lifestyle, diet, exercise, preventative care, vaccinations, sick and well care, proper use of emergency dept and after hours care, as well as other concerns.     Recommendations: Continue to return yearly for your annual wellness and preventative care visits.  This gives us  a chance to discuss healthy lifestyle, exercise, vaccinations, review your chart record, and perform screenings where appropriate.  I recommend you see your eye doctor yearly for routine vision care.  I recommend you see your dentist yearly for routine dental care including hygiene visits twice yearly.   Vaccination recommendations were reviewed Immunization History  Administered Date(s) Administered   Moderna Sars-Covid-2 Vaccination 06/15/2020   PFIZER(Purple Top)SARS-COV-2 Vaccination 09/07/2019, 09/28/2019, 12/04/2020   Pfizer Covid-19 Vaccine Bivalent Booster 57yrs & up 08/11/2021   Pfizer(Comirnaty)Fall Seasonal Vaccine 12 years and  older 05/27/2022   Td 04/14/1995   Tdap 12/19/2013, 11/24/2023   Zoster Recombinant(Shingrix) 12/04/2020, 03/07/2021   Tdap and  Prevnar 20 recommended.   Counseled on the Tdap (tetanus, diptheria, and acellular pertussis) vaccine.  Vaccine information sheet given. Tdap vaccine given after consent obtained.  Declines Prevnar    Screening for cancer: Colon cancer screening: I reviewed your colonoscopy on file that is up to date from 2020, plan repeat 7 -10 years. Sent home with fecal FIT stool test today  We discussed PSA, prostate exam, and prostate cancer screening risks/benefits.     Skin cancer screening: Check your skin regularly for new changes, growing lesions, or other lesions of concern Come in for evaluation if you have skin lesions of concern.  Lung cancer screening: If you have a greater than 20 pack year history of tobacco use, then you may qualify for lung cancer screening with a chest CT scan.   Please call your insurance company to inquire about coverage for this test.  We currently don't have screenings for other cancers besides breast, cervical, colon, and lung cancers.  If you have a strong family history of cancer or have other cancer screening concerns, please let me know.    Bone health: Get at least 150 minutes of aerobic exercise weekly Get weight bearing exercise at least once weekly Bone density test:  A bone density test is an imaging test that uses a type of X-ray to measure the amount of calcium  and other minerals in your bones. The test may be used to diagnose or screen you for a condition that causes weak or thin bones (osteoporosis), predict your risk for a broken bone (fracture), or determine how well your osteoporosis treatment is working. The bone density test is recommended for females 65 and older, or females or males <65 if certain risk factors such as thyroid disease, long term use of steroids such as for asthma or rheumatological issues,  vitamin D deficiency, estrogen deficiency, family history of osteoporosis, self or family history of fragility fracture in first degree relative.    Heart health: Get at least 150 minutes of aerobic exercise weekly Limit alcohol It is important to maintain a healthy blood pressure and healthy cholesterol numbers  Heart disease screening: Screening for heart disease  includes screening for blood pressure, fasting lipids, glucose/diabetes screening, BMI height to weight ratio, reviewed of smoking status, physical activity, and diet.    Goals include blood pressure 120/80 or less, maintaining a healthy lipid/cholesterol profile, preventing diabetes or keeping diabetes numbers under good control, not smoking or using tobacco products, exercising most days per week or at least 150 minutes per week of exercise, and eating healthy variety of fruits and vegetables, healthy oils, and avoiding unhealthy food choices like fried food, fast food, high sugar and high cholesterol foods.    We discussed CT coronary calcium  score screening test, $99.    Medical care options: I recommend you continue to seek care here first for routine care.  We try really hard to have available appointments Monday through Friday daytime hours for sick visits, acute visits, and physicals.  Urgent care should be used for after hours and weekends for significant issues that cannot wait till the next day.  The emergency department should be used for significant potentially life-threatening emergencies.  The emergency department is expensive, can often have long wait times for less significant concerns, so try to utilize primary care, urgent care, or telemedicine when possible to avoid unnecessary trips to the emergency department.  Virtual visits and telemedicine have been introduced since the pandemic started in 2020, and can be convenient ways to receive medical care.  We offer virtual appointments as well to assist you in a variety  of options to seek medical care.    Separate significant issues discussed: Hypertension-continue current medication amlodipine  olmesartan  10/20 mg daily, labs today  Hyperlipidemia - continue statin rosuvastatin  Crestor  10 mg daily, labs today  Impaired glucose- labs today  Insomnia-doing ok current therapy temazepam  15 mg daily as needed  Genital herpes - uses valtrex  prn  Allergic rhinitis-continue Flonase  nasal, Patanol ophthalmic solution, and oral Zyrtec  10 mg daily during needed times of the year   Keandre was seen today for annual exam.  Diagnoses and all orders for this visit:  Encounter for health maintenance examination in adult -     Hemoglobin A1c -     Lipid panel -     CMP14+EGFR -     PSA -     Cancel: Urinalysis, Routine w reflex microscopic -     CBC  Essential hypertension, benign -     CMP14+EGFR  Hyperlipidemia, unspecified hyperlipidemia type -     Lipid panel  Screening for prostate cancer -     PSA  Impaired fasting blood sugar -     Hemoglobin A1c  Screening for colon cancer  Vaccine counseling  Screening for hematuria or proteinuria -     Cancel: Urinalysis, Routine w reflex microscopic  Need for Tdap vaccination -     Tdap vaccine greater than or equal to 7yo IM  Other orders -     amlodipine -olmesartan  (AZOR ) 10-20 MG tablet; Take 1 tablet by mouth daily. -     rosuvastatin  (CRESTOR ) 10 MG tablet; Take 1 tablet (10 mg total) by mouth at bedtime. -     tadalafil  (CIALIS ) 20 MG tablet; Take 1 tablet (20 mg total) by mouth daily as needed for erectile dysfunction. -     valACYclovir  (VALTREX ) 1000 MG tablet; 2 tablets po x 2 days for flare up -     temazepam  (RESTORIL ) 15 MG capsule; Take 1 capsule (15 mg total) by mouth at bedtime as needed for sleep.    Follow-up pending labs, yearly for physical

## 2023-11-24 NOTE — Addendum Note (Signed)
 Addended by: Claudene Crystal on: 11/24/2023 09:07 AM   Modules accepted: Orders

## 2023-11-24 NOTE — Addendum Note (Signed)
 Addended by: Claudene Crystal on: 11/24/2023 09:37 AM   Modules accepted: Orders

## 2023-11-25 ENCOUNTER — Telehealth: Payer: Self-pay

## 2023-11-25 ENCOUNTER — Other Ambulatory Visit: Payer: Self-pay | Admitting: Medical

## 2023-11-25 ENCOUNTER — Ambulatory Visit: Payer: Self-pay | Admitting: Medical

## 2023-11-25 ENCOUNTER — Other Ambulatory Visit (HOSPITAL_COMMUNITY): Payer: Self-pay

## 2023-11-25 LAB — LIPID PANEL
Cholesterol, Total: 212 mg/dL — ABNORMAL HIGH (ref 100–199)
HDL: 43 mg/dL (ref >39)
LDL CALC COMMENT:: 4.9 ratio (ref 0.0–5.0)
LDL Chol Calc (NIH): 145 mg/dL — ABNORMAL HIGH (ref 0–99)
Triglycerides: 132 mg/dL (ref 0–149)
VLDL Cholesterol Cal: 24 mg/dL (ref 5–40)

## 2023-11-25 LAB — CMP14+EGFR
ALT: 18 IU/L (ref 0–44)
AST: 16 IU/L (ref 0–40)
Albumin: 4.4 g/dL (ref 3.8–4.9)
Alkaline Phosphatase: 86 IU/L (ref 44–121)
BUN/Creatinine Ratio: 9 (ref 9–20)
BUN: 10 mg/dL (ref 6–24)
Bilirubin Total: 0.4 mg/dL (ref 0.0–1.2)
CO2: 23 mmol/L (ref 20–29)
Calcium: 9.2 mg/dL (ref 8.7–10.2)
Chloride: 104 mmol/L (ref 96–106)
Creatinine, Ser: 1.06 mg/dL (ref 0.76–1.27)
Globulin, Total: 2.6 g/dL (ref 1.5–4.5)
Glucose: 81 mg/dL (ref 70–99)
Potassium: 4.3 mmol/L (ref 3.5–5.2)
Sodium: 139 mmol/L (ref 134–144)
Total Protein: 7 g/dL (ref 6.0–8.5)
eGFR: 83 mL/min/{1.73_m2} (ref >59)

## 2023-11-25 LAB — CBC
Hematocrit: 43.2 % (ref 37.5–51.0)
Hemoglobin: 13.3 g/dL (ref 13.0–17.7)
MCH: 23.8 pg — ABNORMAL LOW (ref 26.6–33.0)
MCHC: 30.8 g/dL — ABNORMAL LOW (ref 31.5–35.7)
MCV: 77 fL — ABNORMAL LOW (ref 79–97)
Platelets: 425 10*3/uL (ref 150–450)
RBC: 5.59 x10E6/uL (ref 4.14–5.80)
RDW: 14.7 % (ref 11.6–15.4)
WBC: 8.6 10*3/uL (ref 3.4–10.8)

## 2023-11-25 LAB — PSA: Prostate Specific Ag, Serum: 0.9 ng/mL (ref 0.0–4.0)

## 2023-11-25 LAB — HEMOGLOBIN A1C
Est. average glucose Bld gHb Est-mCnc: 143 mg/dL
Hgb A1c MFr Bld: 6.6 % — ABNORMAL HIGH (ref 4.8–5.6)

## 2023-11-25 MED ORDER — ROSUVASTATIN CALCIUM 20 MG PO TABS: 20.0000 mg | ORAL_TABLET | Freq: Every day | ORAL | 3 refills | Status: AC

## 2023-11-25 NOTE — Telephone Encounter (Signed)
 Pharmacy Patient Advocate Encounter  I have received a P/A request via the pts preferred pharmacy. However no p/a is needed and the pharmacy has been contacted to fill the Rx.  Insurance verification completed.   The patient is insured through [facility] ADVANTAGE/RX ADVANCE   Ran test claim for TEMAZEPAM . Currently a quantity of 30 is a 30 day supply and the co-pay is 4.84 . No p/a is needed at this time    This test claim was processed through  [facility]- copay amounts may vary at other pharmacies due to pharmacy/plan contracts, or as the patient moves through the different stages of their insurance plan.

## 2023-11-25 NOTE — Progress Notes (Signed)
 Your A1c diabetes marker is in the diabetic range now at 6.6%.  Cholesterol not at goal, LDL bad cholesterol 145 whereas it should be closer to 100.  Blood counts stable.  Liver kidney and electrolytes okay.  Prostate marker okay.  I recommend increasing the Crestor  to 20 mg daily.  Continue rest of medicines as usual.  I would recommend considering a medication such as Mounjaro which is a weekly injectable diabetes medicine to help control blood sugars, to help with weight loss and to help prevent complications of diabetes.  There are certainly other medicines we can use for blood sugar control but this medicine works pretty well with both weight loss and diabetes control assuming insurance will pay for it  Look at your diet and make any changes necessary to cut down on sugars or lots of carbohydrate intake.  Avoid sugary drinks such as sweet tea and soda.  Exercise several days per week.  Let me know if agreeable to the Mounjaro

## 2023-12-30 ENCOUNTER — Telehealth: Payer: Self-pay

## 2023-12-30 NOTE — Telephone Encounter (Signed)
 Pt. Agreed to Mounjaro now.   Copied from CRM [id number]. Topic: Clinical - Lab/Test Results >> Dec 30, 2023  2:37 PM Charles Rojas wrote: Reason for CRM: Patient called. States he spoke with someone about labs and told them he did not want to start Mounjaro. Patient has had time to think about it and read up on it and thinks he is ready to start. [facility] sent to [id number] [place], [place] - [address] [place] [address] Or contact him with in question/concerns. Thank You

## 2024-01-02 ENCOUNTER — Other Ambulatory Visit (HOSPITAL_COMMUNITY): Payer: Self-pay

## 2024-01-02 ENCOUNTER — Other Ambulatory Visit: Payer: Self-pay | Admitting: Medical

## 2024-01-02 ENCOUNTER — Telehealth: Payer: Self-pay

## 2024-01-02 MED ORDER — MOUNJARO 5 MG/0.5ML ~~LOC~~ SOAJ: 5.0000 mg | SUBCUTANEOUS | 0 refills | Status: DC

## 2024-01-02 MED ORDER — MOUNJARO 2.5 MG/0.5ML ~~LOC~~ SOAJ: 2.5000 mg | SUBCUTANEOUS | 0 refills | Status: AC

## 2024-01-02 NOTE — Telephone Encounter (Signed)
 P/A Approved as orders were sent in today.  Effective 6.23.25 to 6.23.28

## 2024-01-02 NOTE — Telephone Encounter (Signed)
 Pharmacy Patient Advocate Encounter   Received notification from Physician's Office that prior authorization for mounjaro is required/requested.   Insurance verification completed.   The patient is insured through [facility] .   Per test claim: PA required; PA submitted to above mentioned insurance via CoverMyMeds Key/confirmation #/EOC (Key: [id number])    Status is pending

## 2024-01-02 NOTE — Telephone Encounter (Signed)
 Pharmacy Patient Advocate Encounter  Received notification from [facility] that Prior Authorization for Mounjaro 2.5MG /0.5ML auto-injectors has been APPROVED from 6.23.25 to 6.23.28. Ran test claim, Copay is $30.00. This test claim was processed through [facility]- copay amounts may vary at other pharmacies due to pharmacy/plan contracts, or as the patient moves through the different stages of their insurance plan.   PA #/Case ID/Reference #: (Key: [id number])

## 2024-02-13 ENCOUNTER — Telehealth: Payer: Self-pay | Admitting: Internal Medicine

## 2024-02-13 ENCOUNTER — Other Ambulatory Visit: Payer: Self-pay | Admitting: Medical

## 2024-02-13 NOTE — Telephone Encounter (Unsigned)
 Copied from CRM [id number]. Topic: Clinical - Prescription Issue >> Feb 13, 2024  2:56 PM Graeme ORN wrote: Reason for CRM: Patient called. States he completed 1st pack of Mounjaro  2.5 last week. Ready for next dose. He was unsure if it needed to be sent in to Pharmacy. Let him know th 5.0 was already sent to pharmacy. Checking to make sure it does not need to prior auth so he does not have any issues getting medication. Next dose due yesterday. Thank You

## 2024-02-13 NOTE — Telephone Encounter (Signed)
 He was increased to 5mg  after taking 2.5mg 

## 2024-03-07 ENCOUNTER — Telehealth: Payer: Self-pay

## 2024-03-07 ENCOUNTER — Other Ambulatory Visit: Payer: Self-pay | Admitting: Medical

## 2024-03-07 MED ORDER — GABAPENTIN 100 MG PO CAPS
100.0000 mg | ORAL_CAPSULE | Freq: Two times a day (BID) | ORAL | 2 refills | Status: AC
Start: 2024-03-07 — End: ?

## 2024-03-07 NOTE — Telephone Encounter (Signed)
 Copied from CRM [id number]. Topic: Clinical - Medication Question >> Mar 07, 2024  3:40 PM Fonda T wrote: Reason for CRM: Received call from patient, states he requested a medication refill for gabapentin  (NEURONTIN ) 100 MG capsule, and pharmacy informed patient refill request was denied.  Per chart review medication was discontinued by Alm Gent, PA on 11/24/23.  Informed patient of above, states he needs to restart medication due nerve inflammation/pain from previous injury. Patient is inquiring if he can restart medication.   Patient can be reached to discuss further if medication can be restarted, can be reached at [phone].  [id number] [place], [place] - [address] [place] [address] Phone: [phone] Fax: [phone]

## 2024-03-19 ENCOUNTER — Other Ambulatory Visit: Payer: Self-pay | Admitting: Medical

## 2024-03-19 NOTE — Telephone Encounter (Unsigned)
 Copied from CRM [id number]. Topic: Clinical - Medication Refill >> Mar 19, 2024  2:50 PM Amy B wrote: Medication:  tirzepatide  (MOUNJARO ) 5 MG/0.5ML Pen    Has the patient contacted their pharmacy? Yes (Agent: If no, request that the patient contact the pharmacy for the refill. If patient does not wish to contact the pharmacy document the reason why and proceed with request.) (Agent: If yes, when and what did the pharmacy advise?)  This is the patient's preferred pharmacy:  [facility] [address] [place] - [address] [place] [address] Phone: [phone] Fax: [phone]  Phone: [phone] Fax: [phone]  Is this the correct pharmacy for this prescription? Yes If no, delete pharmacy and type the correct one.   Has the prescription been filled recently? No  Is the patient out of the medication? Yes  Has the patient been seen for an appointment in the last year OR does the patient have an upcoming appointment? Yes  Can we respond through MyChart? No  Agent: Please be advised that Rx refills may take up to 3 business days. We ask that you follow-up with your pharmacy.

## 2024-04-04 ENCOUNTER — Encounter: Payer: Self-pay | Admitting: Family Medicine

## 2024-04-04 ENCOUNTER — Ambulatory Visit: Admitting: Family Medicine

## 2024-04-04 VITALS — BP 128/78 | HR 92 | Ht 72.0 in | Wt 249.4 lb

## 2024-04-04 DIAGNOSIS — G5621 Lesion of ulnar nerve, right upper limb: Secondary | ICD-10-CM | POA: Diagnosis not present

## 2024-04-04 NOTE — Progress Notes (Signed)
   Subjective:    Patient ID: Charles Arvil Raddle., male    DOB: 09-08-1970, 53 y.o.   MRN: [id number]  HPI He states that for the last 4 weeks he has noted a tingling sensation in his forearm and now with numbness down into the fifth finger and to a lesser degree the fourth fingertip.  He does note that he rests his right forearm on an armrest for 3 or 4 hours every day but cannot necessarily related to that.  No history of injury to it.   Review of Systems     Objective:    Physical Exam Exam of the right hand does show decreased sensation to the fifth finger and to a lesser extent the fourth fingertip.  Strength is normal.  Sensation is slightly decreased in that area.  Percussion over the ulnar notch causes no reoccurrence of the symptoms.       Assessment & Plan:  Ulnar neuropathy at elbow, right I explained that I thought this was a nerve being pinched and explained the possible mechanism at the elbow.  Discussed the fact that he needs to keep his elbow greater than 90 degrees and to not put any pressure over the ulnar notch.  He will keep me informed as to whether this is enough to take care of his symptoms.  If not then I will have him return for reevaluation and possible referral for nerve conduction studies and orthopedic referral. Flu shot offered but he refused.

## 2024-04-20 ENCOUNTER — Other Ambulatory Visit: Payer: Self-pay | Admitting: Medical

## 2024-04-20 NOTE — Telephone Encounter (Signed)
Pt dose was increased to 5mg 

## 2024-06-14 ENCOUNTER — Other Ambulatory Visit: Payer: Self-pay | Admitting: Medical

## 2024-12-04 ENCOUNTER — Encounter: Payer: Self-pay | Admitting: Medical
